# Patient Record
Sex: Female | Born: 1955
Health system: Southern US, Community
[De-identification: ages and names within clinical notes are randomized; demographics above are authoritative.]

## PROBLEM LIST (undated history)

## (undated) DIAGNOSIS — K219 Gastro-esophageal reflux disease without esophagitis: Secondary | ICD-10-CM

## (undated) DIAGNOSIS — Z9989 Dependence on other enabling machines and devices: Secondary | ICD-10-CM

## (undated) DIAGNOSIS — M1990 Unspecified osteoarthritis, unspecified site: Secondary | ICD-10-CM

## (undated) DIAGNOSIS — I10 Essential (primary) hypertension: Secondary | ICD-10-CM

## (undated) DIAGNOSIS — G4733 Obstructive sleep apnea (adult) (pediatric): Secondary | ICD-10-CM

## (undated) HISTORY — PX: TONSILLECTOMY: SUR1361

## (undated) HISTORY — PX: ABDOMINAL HYSTERECTOMY: SHX81

## (undated) HISTORY — DX: Essential (primary) hypertension: I10

## (undated) HISTORY — PX: OTHER SURGICAL HISTORY: SHX169

## (undated) HISTORY — PX: DILATION AND CURETTAGE OF UTERUS: SHX78

## (undated) HISTORY — DX: Dependence on other enabling machines and devices: Z99.89

## (undated) HISTORY — DX: Unspecified osteoarthritis, unspecified site: M19.90

## (undated) HISTORY — PX: BUNIONECTOMY: SHX129

## (undated) HISTORY — DX: Obstructive sleep apnea (adult) (pediatric): G47.33

---

## 1998-12-06 ENCOUNTER — Other Ambulatory Visit: Admission: RE | Admit: 1998-12-06 | Discharge: 1998-12-06 | Payer: Self-pay | Admitting: Obstetrics and Gynecology

## 1998-12-17 ENCOUNTER — Encounter: Admission: RE | Admit: 1998-12-17 | Discharge: 1998-12-17 | Payer: Self-pay | Admitting: Obstetrics and Gynecology

## 1999-12-19 ENCOUNTER — Other Ambulatory Visit: Admission: RE | Admit: 1999-12-19 | Discharge: 1999-12-19 | Payer: Self-pay | Admitting: Obstetrics and Gynecology

## 1999-12-31 ENCOUNTER — Encounter: Payer: Self-pay | Admitting: Obstetrics and Gynecology

## 1999-12-31 ENCOUNTER — Encounter: Admission: RE | Admit: 1999-12-31 | Discharge: 1999-12-31 | Payer: Self-pay | Admitting: Obstetrics and Gynecology

## 2000-12-21 ENCOUNTER — Other Ambulatory Visit: Admission: RE | Admit: 2000-12-21 | Discharge: 2000-12-21 | Payer: Self-pay | Admitting: Obstetrics and Gynecology

## 2000-12-23 ENCOUNTER — Ambulatory Visit (HOSPITAL_COMMUNITY): Admission: RE | Admit: 2000-12-23 | Discharge: 2000-12-23 | Payer: Self-pay | Admitting: Obstetrics and Gynecology

## 2000-12-23 ENCOUNTER — Encounter: Payer: Self-pay | Admitting: Obstetrics and Gynecology

## 2001-01-07 ENCOUNTER — Encounter: Admission: RE | Admit: 2001-01-07 | Discharge: 2001-01-07 | Payer: Self-pay | Admitting: Obstetrics and Gynecology

## 2001-01-07 ENCOUNTER — Encounter: Payer: Self-pay | Admitting: Obstetrics and Gynecology

## 2001-09-28 ENCOUNTER — Encounter (INDEPENDENT_AMBULATORY_CARE_PROVIDER_SITE_OTHER): Payer: Self-pay | Admitting: Specialist

## 2001-09-28 ENCOUNTER — Inpatient Hospital Stay (HOSPITAL_COMMUNITY): Admission: EM | Admit: 2001-09-28 | Discharge: 2001-09-30 | Payer: Self-pay | Admitting: Obstetrics and Gynecology

## 2002-01-10 ENCOUNTER — Encounter: Payer: Self-pay | Admitting: Obstetrics and Gynecology

## 2002-01-10 ENCOUNTER — Encounter: Admission: RE | Admit: 2002-01-10 | Discharge: 2002-01-10 | Payer: Self-pay | Admitting: Obstetrics and Gynecology

## 2003-01-13 ENCOUNTER — Encounter: Admission: RE | Admit: 2003-01-13 | Discharge: 2003-01-13 | Payer: Self-pay | Admitting: Obstetrics and Gynecology

## 2004-01-17 ENCOUNTER — Encounter: Admission: RE | Admit: 2004-01-17 | Discharge: 2004-01-17 | Payer: Self-pay | Admitting: Obstetrics and Gynecology

## 2005-01-17 ENCOUNTER — Encounter: Admission: RE | Admit: 2005-01-17 | Discharge: 2005-01-17 | Payer: Self-pay | Admitting: Obstetrics and Gynecology

## 2007-02-03 ENCOUNTER — Encounter: Admission: RE | Admit: 2007-02-03 | Discharge: 2007-02-03 | Payer: Self-pay | Admitting: Obstetrics and Gynecology

## 2008-12-14 ENCOUNTER — Encounter: Admission: RE | Admit: 2008-12-14 | Discharge: 2008-12-14 | Payer: Self-pay | Admitting: Obstetrics and Gynecology

## 2008-12-22 ENCOUNTER — Encounter: Admission: RE | Admit: 2008-12-22 | Discharge: 2008-12-22 | Payer: Self-pay | Admitting: Obstetrics and Gynecology

## 2009-12-25 ENCOUNTER — Encounter: Admission: RE | Admit: 2009-12-25 | Discharge: 2009-12-25 | Payer: Self-pay | Admitting: Obstetrics and Gynecology

## 2010-03-24 ENCOUNTER — Encounter: Payer: Self-pay | Admitting: Obstetrics and Gynecology

## 2010-07-19 NOTE — H&P (Signed)
Casey Newton, Casey Newton                         ACCOUNT NO.:  [id number]   MEDICAL RECORD NO.:  [id number]                   PATIENT TYPE:   LOCATION:                                       FACILITY:   PHYSICIAN:  Malachi Pro. Ambrose Mantle, M.D.              DATE OF BIRTH:   DATE OF ADMISSION:  09/28/2001  DATE OF DISCHARGE:                                HISTORY & PHYSICAL   PRESENT ILLNESS:  This is a 55 year old black female, para 2-0-0-2, who is  admitted to the hospital for abdominal hysterectomy and because of severe  menorrhagia and history of recurrent anemia.  Last menstrual period, September 14, 2001.  Her periods last about five days, with heavy for three days.  She  uses at least 10 pads a day with an overnight pad on her heavy days.  She  also soils her clothing in spite of the overnight pads.  She has cramping  with ovulation and has been placed on iron on several occasions.  When she  was seen in our office in October 2002 her hemoglobin was 8.9.  Dr. Artis Flock  had found her blood count to be low and had prescribed iron; gave her  Hemoccult.  The patient's hemoglobin had risen to 10.3 in June 2003.  Because of her continued heavy bleeding and ultrasound showing fibroids, one  of which probably has a submucous component, the patient is prepared for  hysterectomy.   ALLERGIES:  No known allergies.   PAST SURGICAL HISTORY:  1. Two C-sections.  2. Bunions removed.  3. Tubal ligation at the time of her second cesarean section.  4. Pilonidal cystectomy.  5. Cryosurgery for moderate dysplasia of the cervix.   PAST MEDICAL HISTORY:  She has had no major adult illnesses.  She is taking  medications for anemia (Chromagen).  She has no major heart problems.  She  does not drink nor smoke.   REVIEW OF SYMPTOMS:  Negative, except for occasional dizziness and  occasional pressure in her head.   FAMILY HISTORY:  Mother is 52, living and well.  Father 24 with high blood  pressure.  One sister  and one brother are living and well.   SOCIAL HISTORY:  The patient works as an Airline pilot.  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   PHYSICAL EXAMINATION:   GENERAL:  Reveals a well developed, somewhat obese, black female; in no  acute distress.   VITAL SIGNS:  Blood pressure 150/88, pulse 60, weight 190 pounds.   HEENT:  Reveal no cranial abnormalities.  Extraocular movements intact.  Nose and pharynx are clear.   NECK:  Supple without thyromegaly.   BREASTS:  Soft without masses.   HEART:  Normal size and sounds.  No murmurs.   LUNGS:  Clear to auscultation and percussion.   ABDOMEN:  Soft and flat, nontender.  No masses are palpable.  The  patient  has previous transverse incisions for two C-sections.   PELVIC:  The vulva and vagina are clean.  The cervix is clean.  A Pap smear  on December 21, 2000 was within normal limits.  The uterus is anterior, about  8-10 weeks size.  There is about a 4-5 cm anterior fibroid.  The adnexa are  free of masses.   RECTAL:  Exam done in October 2002 was negative.   ADMITTING IMPRESSION:  1. Severe menorrhagia.  2. History of significant anemia.  3. Mid-cycle pelvic  cramping.   PLAN:  The patient is prepared for hysterectomy.  The abdominal route is  chosen because of her prior two C-sections.  She wants to preserve her  ovaries and we plan to preserve the ovaries unless they are significantly  diseased.  She has also been counseled about the risks of surgery, including  but not limited, to heart attack, stroke, pulmonary embolus, wound  disruption, hemorrhage, need for re-operation and/or transfusion, fistula  formation, nerve injury and intestinal obstruction.  She understands and  agrees to proceed with surgery.  She is also told of the unpredictable  impact on her sex drive and sex function.  She understands.                                                Malachi Pro. Ambrose Mantle, M.D.    TFH/MEDQ  D:   09/28/2001  T:  09/28/2001  Job:  [id number]

## 2010-07-19 NOTE — Discharge Summary (Signed)
NAMELAYTON, TAPPAN                     ACCOUNT NO.:  [id number]   MEDICAL RECORD NO.:  [id number]                   PATIENT TYPE:  OBV   LOCATION:  0460                                 FACILITY:  [facility]   PHYSICIAN:  Malachi Pro. Ambrose Mantle, M.D.              DATE OF BIRTH:  10-31-1955   DATE OF ADMISSION:  09/28/2001  DATE OF DISCHARGE:  09/30/2001                                 DISCHARGE SUMMARY   HOSPITAL COURSE:  The patient is a 55 year old black female admitted for  abdominal hysterectomy because of severe menorrhagia and history of  significant anemia and fibroids.  The patient underwent abdominal  hysterectomy by Dr. Ambrose Mantle with Dr. Senaida Ores assisting under general  anesthesia with blood loss of about 300 cc.  Postoperatively the patient did  quite well.  She was doing well on the second postoperative day, and she was  ready for discharge.  She was ambulating well without difficulty, tolerating  liquids, and voiding well.  She had passed no flatus, but her abdomen was  soft and nontender.  Her incision appeared to be healing well.  On admission  her white count was 3700, hemoglobin 11.5, hematocrit 36.5, platelet count  314,000, follow-up hematocrits were 32.8 and 31.9, 49 neutrophils, 34  lymphs, 14 monos, 2 eosinophils, and 1 basophil.  Comprehensive metabolic  profile was completely within normal limits.  Urinalysis was negative.  Pathology report showed 223 g uterus with squamous metaplasia of the cervix.  No dysplasia.  Secretory endometrium.  No hyperplasia or malignancy.  Adenomyosis, leiomyomata, submucosal, intramural, and subserosal.  Uterine  serosal fibrous adhesions.  No endometriosis or malignancy identified.  Benign right and left fallopian tube segments, and benign paratubal cyst.  Benign squamous fragments with focal inflammation.  EKG showed borderline  EKG.  Minimal voltage criteria for LVH, may be normal variant.  Normal sinus  rhythm.   FINAL  DIAGNOSES:  1. Menorrhagia.  2. Anemia.  3. Leiomyomata uteri.  4. Adenomyosis.   OPERATION:  Abdominal hysterectomy.  Patient requested leaving her ovaries.   FINAL CONDITION:  Improved.   DISCHARGE INSTRUCTIONS:  Clear liquid diet or full liquid diet until passing  flatus or having a bowel movement.  Report any temperature elevation greater  than 100.4 degrees.  Report any heavy vaginal bleeding.  No strenuous  lifting or exertional activity.  No vaginal entrance.   FOLLOW-UP:  Return to the office in one to four days to have her staples  removed.  Return to the office as directed.   MEDICATIONS:  Mepergan Fortis 16 tablets 1 every four to six hours as needed  for pain.                                               Malachi Pro. Ambrose Mantle, M.D.  TFH/MEDQ  D:  09/30/2001  T:  10/06/2001  Job:  [id number]

## 2010-07-19 NOTE — Op Note (Signed)
Casey Newton, PANCHAL A                    ACCOUNT NO.:  [id number]   MEDICAL RECORD NO.:  [id number]                   PATIENT TYPE:  OBV   LOCATION:  0460                                 FACILITY:  [facility]   PHYSICIAN:  Malachi Pro. Ambrose Mantle, M.D.              DATE OF BIRTH:  [birth year]   DATE OF PROCEDURE:  09/28/2001  DATE OF DISCHARGE:                                 OPERATIVE REPORT   PREOPERATIVE DIAGNOSIS:  Menorrhagia, anemia, fibroids.   POSTOPERATIVE DIAGNOSIS:  Same.   OPERATION:  Abdominal hysterectomy.   SURGEON:  Malachi Pro. Ambrose Mantle, M.D.   Threasa HeadsSenaida Ores   ANESTHESIA:  General.   DESCRIPTION OF PROCEDURE:  The patient was brought to the operating room and  placed under satisfactory general anesthesia. She was placed in the frog-leg  position.  The abdomen, vulva, vagina and urethra were prepped with Betadine  solution and a Foley catheter was inserted straight drain.  Examination  revealed the uterus to be multinodular with fibroids, about eight to ten  weeks size.  The adnexae were free of masses.  The patient was placed  supine.  The abdomen was draped as a sterile field and a transverse incision  was made through the old C-section scars and carried in layers through the  skin, subcutaneous tissue and fascia.   The fascia was separated from the rectus muscles superiorly and inferiorly  and the rectus muscle was then divided in the midline.  There was some  bleeding that was sutured and also treated with Bovie.  Exploration of the  upper abdomen after entering the peritoneal cavity revealed both kidneys to  feel normal.  Just the edge of the liver could be felt and it was normal.  The gallbladder was smooth and tensely cystic but I did not feel any stones.  Exploration of the pelvis revealed the uterus to be multinodular, about ten  weeks size.   Both tubes had previously been ligated.  The ovaries were normal.  The cul-  de-sacs were free of disease.   The upper pedicles were clamped across after  packs and retractors were used for exposure.  The round ligaments were  bilaterally divided with electrical current and the bladder flap was  developed.  The upper pedicles were then cut between clamps and doubly  suture ligated.  The uterine vessels were skeletonized, clamped, cut and  suture ligated and the parametrial and paracervical tissues were clamped,  cut and suture ligated.   The uterosacral ligaments were clamped, cut and suture ligated and held and  the right vaginal angle was entered and the uterus was removed by  transecting the upper vagina.  There was a small amount of tissue close to  the right vaginal angle that we thought possibly could have included some  cervix.  I removed this.  I then placed vaginal angle sutures and closed the  central portion of the vagina with interrupted figure-of-eight  sutures of 0  nylon.  Hemostasis was adequate.  Both ureters were palpated and were  normal.   The uterosacral ligaments were sutured together in the midline.  The bladder  was filled with methylene blue stained fluid.  There was no leakage and no  sutures close to the bladder.  I then reperitonealized with 0 Vicryl over  the vaginal cuff after liberal irrigation confirmed hemostasis.  Then, the  packs and retractors were removed.  The peritoneum was closed with a running  suture of 0 Vicryl.  The rectus muscle was not sutured but hemostasis was  achieved in the rectus muscle and then the fascia was closed with two  running sutures of 0 Vicryl, the subcu with a running 3-0 Vicryl and the  skin was closed with automatic staples.   The patient seemed to tolerate the procedure well.  The blood loss was  estimated at 250 cc.  The sponge and needle counts were correct and she was  returned to the recovery room in satisfactory condition.                                               Malachi Pro. Ambrose Mantle, M.D.    TFH/MEDQ  D:  09/28/2001   T:  09/30/2001  Job:  [id number]   cc:   Malachi Pro. Ambrose Mantle, M.D.

## 2011-01-02 ENCOUNTER — Other Ambulatory Visit: Payer: Self-pay | Admitting: Obstetrics and Gynecology

## 2011-01-02 DIAGNOSIS — Z1231 Encounter for screening mammogram for malignant neoplasm of breast: Secondary | ICD-10-CM

## 2011-01-06 ENCOUNTER — Ambulatory Visit
Admission: RE | Admit: 2011-01-06 | Discharge: 2011-01-06 | Disposition: A | Discharge status: Home / Self Care | Payer: BC Managed Care – PPO | Source: Ambulatory Visit | Attending: Obstetrics and Gynecology | Admitting: Obstetrics and Gynecology

## 2011-01-06 DIAGNOSIS — Z1231 Encounter for screening mammogram for malignant neoplasm of breast: Secondary | ICD-10-CM

## 2011-12-26 ENCOUNTER — Other Ambulatory Visit: Payer: Self-pay | Admitting: Obstetrics and Gynecology

## 2011-12-26 DIAGNOSIS — Z1231 Encounter for screening mammogram for malignant neoplasm of breast: Secondary | ICD-10-CM

## 2012-01-12 ENCOUNTER — Ambulatory Visit: Payer: BC Managed Care – PPO

## 2012-06-30 ENCOUNTER — Ambulatory Visit
Admission: RE | Admit: 2012-06-30 | Discharge: 2012-06-30 | Disposition: A | Discharge status: Home / Self Care | Payer: BC Managed Care – PPO | Source: Ambulatory Visit | Attending: Obstetrics and Gynecology | Admitting: Obstetrics and Gynecology

## 2012-06-30 DIAGNOSIS — Z1231 Encounter for screening mammogram for malignant neoplasm of breast: Secondary | ICD-10-CM

## 2012-09-15 ENCOUNTER — Ambulatory Visit (HOSPITAL_BASED_OUTPATIENT_CLINIC_OR_DEPARTMENT_OTHER): Payer: BC Managed Care – PPO | Attending: Unknown Physician Specialty | Admitting: Radiology

## 2012-09-15 VITALS — Ht 61.0 in | Wt 220.0 lb

## 2012-09-15 DIAGNOSIS — G4733 Obstructive sleep apnea (adult) (pediatric): Secondary | ICD-10-CM | POA: Insufficient documentation

## 2012-09-15 DIAGNOSIS — R0683 Snoring: Secondary | ICD-10-CM

## 2012-09-18 DIAGNOSIS — R0609 Other forms of dyspnea: Secondary | ICD-10-CM

## 2012-09-18 DIAGNOSIS — G4733 Obstructive sleep apnea (adult) (pediatric): Secondary | ICD-10-CM

## 2012-09-18 DIAGNOSIS — R0989 Other specified symptoms and signs involving the circulatory and respiratory systems: Secondary | ICD-10-CM

## 2012-09-18 NOTE — Procedures (Signed)
Casey Newton, Casey Newton           ACCOUNT NO.:  [id number]  MEDICAL RECORD NO.:  [id number]          PATIENT TYPE:  OUT  LOCATION:  SLEEP CENTER                 FACILITY:  [facility]  PHYSICIAN:  Jalen Oberry D. Maple Hudson, MD, FCCP, FACPDATE OF BIRTH:  1956-01-18  DATE OF STUDY:  09/15/2012                           NOCTURNAL POLYSOMNOGRAM  REFERRING PHYSICIAN:  Pearletha Furl KEEVER  REFERRING PHYSICIAN:  Dr. Theadora Rama.  INDICATION FOR STUDY:  Hypersomnia with sleep apnea.  EPWORTH SLEEPINESS SCORE:  7/24.  BMI 41.6, weight 220 pounds, height 61 inches, neck 17 inches.  HOME MEDICATION:  Charted for review.  SLEEP ARCHITECTURE:  Split study protocol.  During the diagnostic phase, total sleep time 121 minutes with sleep efficiency 81.8%.  Stage I was 7.4%, stage II 56.6%, stage III 12.4%, REM 23.6% of total sleep time. Sleep latency 11 minutes.  REM latency 49 minutes.  Awake after sleep onset 13.5 minutes.  Arousal index 19.3.  Bedtime medication:  None.  RESPIRATORY DATA:  Split study protocol.  Apnea-hypopnea index (AHI) 29.3 per hour.  A total of 59 events were scored including 58 obstructive apneas and 1 hypopnea.  Events were associated with nonsupine sleep position.  REM AHI 63.2 per hour.  CPAP was titrated to 16 CWP, AHI 7.8 per hour.  She wore a small ResMed Quattro FX full-face mask with heated humidifier.  Residual events were mostly central apneas.  OXYGEN DATA:  Before CPAP, snoring was loud with oxygen desaturation to a nadir of 36% transiently on room air.  With CPAP control, snoring was prevented and mean oxygen saturation held 96.4% on room air.  CARDIAC DATA:  Sinus rhythm with occasional PAC.  MOVEMENT/PARASOMNIA:  A few incidental limb jerks were noted with little effect on sleep.  Bathroom x2.  IMPRESSION/RECOMMENDATION: 1. Moderate obstructive sleep apnea/hypopnea syndrome, AHI 29.3 per     hour with mainly nonsupine events.  REM AHI 63.2 per hour.  Loud  snoring with oxygen desaturation transiently to a nadir of 36% on     room air. 2. Successful CPAP titration to 16 CWP, AHI 7.8 per hour.  A few     residual central apneas developed, of little     clinical significance.  She wore a small ResMed Quattro FX full-     face mask with heated humidifier.  Snoring was prevented and mean     oxygen saturation held 96.4% on room air with CPAP.     Laisa Larrick D. Maple Hudson, MD, [facility], FACP Diplomate, American Board of Sleep Medicine    CDY/MEDQ  D:  09/18/2012 10:59:03  T:  09/18/2012 14:07:53  Job:  [id number]

## 2012-12-29 ENCOUNTER — Encounter: Payer: Self-pay | Admitting: Internal Medicine

## 2013-02-17 ENCOUNTER — Ambulatory Visit (AMBULATORY_SURGERY_CENTER): Payer: Self-pay

## 2013-02-17 VITALS — Ht 61.5 in | Wt 221.0 lb

## 2013-02-17 DIAGNOSIS — Z1211 Encounter for screening for malignant neoplasm of colon: Secondary | ICD-10-CM

## 2013-02-17 MED ORDER — NA SULFATE-K SULFATE-MG SULF 17.5-3.13-1.6 GM/177ML PO SOLN: ORAL | Status: DC

## 2013-02-18 ENCOUNTER — Encounter: Payer: Self-pay | Admitting: Internal Medicine

## 2013-03-07 ENCOUNTER — Telehealth: Payer: Self-pay | Admitting: Internal Medicine

## 2013-03-08 NOTE — Telephone Encounter (Signed)
No charge. 

## 2013-03-09 ENCOUNTER — Encounter: Payer: BC Managed Care – PPO | Admitting: Internal Medicine

## 2013-03-24 ENCOUNTER — Telehealth: Payer: Self-pay | Admitting: Internal Medicine

## 2013-03-24 NOTE — Telephone Encounter (Signed)
No charge. 

## 2013-03-28 ENCOUNTER — Encounter: Payer: BC Managed Care – PPO | Admitting: Internal Medicine

## 2013-08-12 ENCOUNTER — Other Ambulatory Visit: Payer: Self-pay

## 2013-08-12 DIAGNOSIS — Z1231 Encounter for screening mammogram for malignant neoplasm of breast: Secondary | ICD-10-CM

## 2013-08-18 ENCOUNTER — Encounter (INDEPENDENT_AMBULATORY_CARE_PROVIDER_SITE_OTHER): Payer: Self-pay

## 2013-08-18 ENCOUNTER — Ambulatory Visit
Admission: RE | Admit: 2013-08-18 | Discharge: 2013-08-18 | Disposition: A | Discharge status: Home / Self Care | Payer: BC Managed Care – PPO | Source: Ambulatory Visit

## 2013-08-18 DIAGNOSIS — Z1231 Encounter for screening mammogram for malignant neoplasm of breast: Secondary | ICD-10-CM

## 2014-06-12 ENCOUNTER — Emergency Department (HOSPITAL_COMMUNITY)
Admission: EM | Admit: 2014-06-12 | Discharge: 2014-06-12 | Disposition: A | Discharge status: Home / Self Care | Payer: BLUE CROSS/BLUE SHIELD | Source: Home / Self Care | Attending: Emergency Medicine | Admitting: Emergency Medicine

## 2014-06-12 ENCOUNTER — Encounter (HOSPITAL_COMMUNITY): Payer: Self-pay | Admitting: *Deleted

## 2014-06-12 DIAGNOSIS — I10 Essential (primary) hypertension: Secondary | ICD-10-CM

## 2014-06-12 LAB — POCT I-STAT, CHEM 8
BUN: 11 mg/dL (ref 6–23)
CREATININE: 1 mg/dL (ref 0.50–1.10)
Calcium, Ion: 1.18 mmol/L (ref 1.12–1.23)
Chloride: 101 mmol/L (ref 96–112)
Glucose, Bld: 98 mg/dL (ref 70–99)
HCT: 45 % (ref 36.0–46.0)
HEMOGLOBIN: 15.3 g/dL — AB (ref 12.0–15.0)
POTASSIUM: 3.9 mmol/L (ref 3.5–5.1)
SODIUM: 141 mmol/L (ref 135–145)
TCO2: 27 mmol/L (ref 0–100)

## 2014-06-12 MED ORDER — LOSARTAN POTASSIUM-HCTZ 100-12.5 MG PO TABS: 1.0000 | ORAL_TABLET | Freq: Every day | ORAL | Status: DC

## 2014-06-12 NOTE — Discharge Instructions (Signed)
Your blood work is normal. I have sent a 3 month supply of your blood pressure medicines your pharmacy. Continue to work on finding a primary care doctor.

## 2014-06-12 NOTE — ED Notes (Signed)
Pt is asking for med refills for her BP     Her last dose was yesterday    She denies any problems

## 2014-06-12 NOTE — ED Provider Notes (Signed)
CSN: [id number]     Arrival date & time 06/12/14  0957 History   First MD Initiated Contact with Patient 06/12/14 1047     Chief Complaint  Patient presents with  . Medication Refill   (Consider location/radiation/quality/duration/timing/severity/associated sxs/prior Treatment) HPI  She is a 59 year old woman here for medication refill. She states she has been struggling to find a regular physician since she retired a year and a half ago. She does have an OB/GYN who she follows with regularly. She is on losartan-hydrochlorothiazide daily for blood pressure. Her last dose was yesterday. No chest pain or shortness of breath.  Past Medical History  Diagnosis Date  . Hypertension   . OSA on CPAP   . Arthritis     left hip   Past Surgical History  Procedure Laterality Date  . Cesarean section      2 times  . Abdominal hysterectomy      left ovaries  . Bunionectomy      left foot  . Tonsillectomy    . Dental implants     Family History  Problem Relation Age of Onset  . Liver disease Mother   . Leukemia Father    History  Substance Use Topics  . Smoking status: Never Smoker   . Smokeless tobacco: Never Used  . Alcohol Use: No   OB History    No data available     Review of Systems  All other systems reviewed and are negative.   Allergies  Penicillins  Home Medications   Prior to Admission medications   Medication Sig Start Date End Date Taking? Authorizing Provider  aspirin 81 MG tablet Take 81 mg by mouth daily.   Yes Historical Provider, MD  calcium carbonate 200 MG capsule Take 800 mg by mouth daily.   Yes Historical Provider, MD  Multiple Vitamin (MULTIVITAMIN) tablet Take 1 tablet by mouth daily.   Yes Historical Provider, MD  Omega-3 Fatty Acids (FISH OIL) 1000 MG CAPS Take by mouth daily.   Yes Historical Provider, MD  PAPAYA PO Take by mouth as needed.   Yes Historical Provider, MD  losartan-hydrochlorothiazide (HYZAAR) 100-12.5 MG per tablet Take 1 tablet  by mouth daily. 06/12/14   Charm RingsErin J Honig, MD  Na Sulfate-K Sulfate-Mg Sulf (SUPREP BOWEL PREP) SOLN Suprep as directed / no substitutions 02/17/13   Iva Booparl E Gessner, MD   BP 177/83 mmHg  Pulse 64  Temp(Src) 98.7 F (37.1 C) (Oral)  Resp 18  SpO2 95% Physical Exam  Constitutional: She is oriented to person, place, and time. She appears well-developed and well-nourished. No distress.  Neck: Neck supple.  Cardiovascular: Normal rate and regular rhythm.   No murmur heard. Pulmonary/Chest: Effort normal.  Neurological: She is alert and oriented to person, place, and time.    ED Course  Procedures (including critical care time) Labs Review Labs Reviewed  POCT I-STAT, CHEM 8 - Abnormal; Notable for the following:    Hemoglobin 15.3 (*)    All other components within normal limits    Imaging Review No results found.   MDM   1. Essential hypertension    I-STAT reviewed and within normal limits. I've provided a 90 day supply of her hypertension medication. She will continue looking for a PCP.    Charm RingsErin J Honig, MD 06/12/14 [phone]

## 2014-08-03 ENCOUNTER — Ambulatory Visit (INDEPENDENT_AMBULATORY_CARE_PROVIDER_SITE_OTHER): Payer: BLUE CROSS/BLUE SHIELD | Admitting: Family Medicine

## 2014-08-03 VITALS — BP 142/80 | HR 67 | Temp 98.4°F | Resp 16 | Ht 61.0 in | Wt 212.8 lb

## 2014-08-03 DIAGNOSIS — L237 Allergic contact dermatitis due to plants, except food: Secondary | ICD-10-CM | POA: Diagnosis not present

## 2014-08-03 MED ORDER — METHYLPREDNISOLONE 4 MG PO TBPK
ORAL_TABLET | ORAL | Status: DC
Start: 2014-08-03 — End: 2014-09-13

## 2014-08-03 NOTE — Progress Notes (Signed)
Chief Complaint:  Chief Complaint  Patient presents with  . Rash    arms,legs, x 4 days ago was working in yard    HPI: Casey Newton is a 59 y.o. female who is here for  dermatitis of her arms and legs. She has had it for the last 4 days. It was due to poison ivy exposure.  Her garden looks great now. She denies any chest pain or shortness of breath. She feels that it might be coming on her face a little bit but there is no visible lesions.  Has tried bendaryl  Past Medical History  Diagnosis Date  . Hypertension   . OSA on CPAP   . Arthritis     left hip   Past Surgical History  Procedure Laterality Date  . Cesarean section      2 times  . Abdominal hysterectomy      left ovaries  . Bunionectomy      left foot  . Tonsillectomy    . Dental implants     History   Social History  . Marital Status: Married    Spouse Name: N/A  . Number of Children: N/A  . Years of Education: N/A   Social History Main Topics  . Smoking status: Never Smoker   . Smokeless tobacco: Never Used  . Alcohol Use: No  . Drug Use: No  . Sexual Activity: Not on file   Other Topics Concern  . None   Social History Narrative   Family History  Problem Relation Age of Onset  . Liver disease Mother   . Leukemia Father    Allergies  Allergen Reactions  . Penicillins     Upset stomach   Prior to Admission medications   Medication Sig Start Date End Date Taking? Authorizing Provider  aspirin 81 MG tablet Take 81 mg by mouth daily.   Yes Historical Provider, MD  calcium carbonate 200 MG capsule Take 800 mg by mouth daily.   Yes Historical Provider, MD  losartan-hydrochlorothiazide (HYZAAR) 100-12.5 MG per tablet Take 1 tablet by mouth daily. 06/12/14  Yes Charm Rings, MD  Multiple Vitamin (MULTIVITAMIN) tablet Take 1 tablet by mouth daily.   Yes Historical Provider, MD  Na Sulfate-K Sulfate-Mg Sulf (SUPREP BOWEL PREP) SOLN Suprep as directed / no substitutions 02/17/13  Yes  Iva Boop, MD  Omega-3 Fatty Acids (FISH OIL) 1000 MG CAPS Take by mouth daily.   Yes Historical Provider, MD  PAPAYA PO Take by mouth as needed.   Yes Historical Provider, MD  methylPREDNISolone (MEDROL DOSEPAK) 4 MG TBPK tablet Take as directed 08/03/14   Georgie Eduardo P Marysa Wessner, DO     ROS: The patient denies fevers, chills, night sweats, unintentional weight loss, chest pain, palpitations, wheezing, dyspnea on exertion, nausea, vomiting, abdominal pain, dysuria, hematuria, melena, numbness, weakness, or tingling.   All other systems have been reviewed and were otherwise negative with the exception of those mentioned in the HPI and as above.    PHYSICAL EXAM: Filed Vitals:   08/03/14 0943  BP: 142/80  Pulse: 67  Temp: 98.4 F (36.9 C)  Resp: 16   Filed Vitals:   08/03/14 0943  Height:  (1.549 m)  Weight: 212 lb 12.8 oz (96.525 kg)   Body mass index is 40.23 kg/(m^2).  General: Alert, no acute distress HEENT:  Normocephalic, atraumatic, oropharynx patent. EOMI, PERRLA Cardiovascular:  Regular rate and rhythm, no rubs murmurs or gallops.  No Carotid bruits, radial pulse intact. No pedal edema.  Respiratory: Clear to auscultation bilaterally.  No wheezes, rales, or rhonchi.  No cyanosis, no use of accessory musculature GI: No organomegaly, abdomen is soft and non-tender, positive bowel sounds.  No masses. Skin: Positive poison ivy dermatitis Neurologic: Facial musculature symmetric. Psychiatric: Patient is appropriate throughout our interaction. Lymphatic: No cervical lymphadenopathy Musculoskeletal: Gait intact.   LABS: Results for orders placed or performed during the hospital encounter of 06/12/14  I-STAT, chem 8  Result Value Ref Range   Sodium 141 135 - 145 mmol/L   Potassium 3.9 3.5 - 5.1 mmol/L   Chloride 101 96 - 112 mmol/L   BUN 11 6 - 23 mg/dL   Creatinine, Ser 9.621.00 0.50 - 1.10 mg/dL   Glucose, Bld 98 70 - 99 mg/dL   Calcium, Ion 9.521.18 8.411.12 - 1.23 mmol/L   TCO2 27  0 - 100 mmol/L   Hemoglobin 15.3 (H) 12.0 - 15.0 g/dL   HCT 32.445.0 40.136.0 - 02.746.0 %     EKG/XRAY:   [facility]   ASSESSMENT/PLAN: Encounter Diagnosis  Name Primary?  . Poison ivy dermatitis Yes   Rx Medrol Dosepak Cont with antihistamine, calamine lotion Fu prn   Gross sideeffects, risk and benefits, and alternatives of medications d/w patient. Patient is aware that all medications have potential sideeffects and we are unable to predict every sideeffect or drug-drug interaction that may occur.  Alece Koppel PHUONG, DO 08/03/2014 11:24 AM

## 2014-09-13 ENCOUNTER — Encounter: Payer: Self-pay | Admitting: Internal Medicine

## 2014-09-13 ENCOUNTER — Ambulatory Visit (INDEPENDENT_AMBULATORY_CARE_PROVIDER_SITE_OTHER): Payer: BLUE CROSS/BLUE SHIELD | Admitting: Internal Medicine

## 2014-09-13 ENCOUNTER — Other Ambulatory Visit (INDEPENDENT_AMBULATORY_CARE_PROVIDER_SITE_OTHER): Payer: BLUE CROSS/BLUE SHIELD

## 2014-09-13 DIAGNOSIS — G4733 Obstructive sleep apnea (adult) (pediatric): Secondary | ICD-10-CM | POA: Insufficient documentation

## 2014-09-13 DIAGNOSIS — I10 Essential (primary) hypertension: Secondary | ICD-10-CM | POA: Insufficient documentation

## 2014-09-13 LAB — LIPID PANEL
CHOL/HDL RATIO: 3
CHOLESTEROL: 148 mg/dL (ref 0–200)
HDL: 55.8 mg/dL (ref >39.00)
LDL CALC: 72 mg/dL (ref 0–99)
NonHDL: 92.2
Triglycerides: 102 mg/dL (ref 0.0–149.0)
VLDL: 20.4 mg/dL (ref 0.0–40.0)

## 2014-09-13 LAB — VITAMIN B12: Vitamin B-12: 579 pg/mL (ref 211–911)

## 2014-09-13 LAB — HEMOGLOBIN A1C: HEMOGLOBIN A1C: 5.6 % (ref 4.6–6.5)

## 2014-09-13 LAB — COMPREHENSIVE METABOLIC PANEL
ALBUMIN: 4.1 g/dL (ref 3.5–5.2)
ALT: 24 U/L (ref 0–35)
AST: 20 U/L (ref 0–37)
Alkaline Phosphatase: 104 U/L (ref 39–117)
BILIRUBIN TOTAL: 0.4 mg/dL (ref 0.2–1.2)
BUN: 12 mg/dL (ref 6–23)
CO2: 33 meq/L — AB (ref 19–32)
CREATININE: 0.89 mg/dL (ref 0.40–1.20)
Calcium: 9.4 mg/dL (ref 8.4–10.5)
Chloride: 104 mEq/L (ref 96–112)
GFR: 83.26 mL/min (ref >60.00)
Glucose, Bld: 100 mg/dL — ABNORMAL HIGH (ref 70–99)
POTASSIUM: 3.9 meq/L (ref 3.5–5.1)
Sodium: 142 mEq/L (ref 135–145)
TOTAL PROTEIN: 7.1 g/dL (ref 6.0–8.3)

## 2014-09-13 NOTE — Progress Notes (Signed)
Pre visit review using our clinic review tool, if applicable. No additional management support is needed unless otherwise documented below in the visit note. 

## 2014-09-13 NOTE — Assessment & Plan Note (Signed)
Patient not exercising since Christmas and up weight. Talked to her about the need for exercise and working on her diet. Checking for sequelae of her weight with labs. It is complicated by OSA and HTN.

## 2014-09-13 NOTE — Patient Instructions (Signed)
We have gotten the order for the CPAP supplies and can send it to the home health company that handles your CPAP.   We will check the blood work and will call you back with the results.   We have sent in the blood pressure medicine.  We can see you back next year. If you have new problems or questions before then please feel free to call the office.  Work on getting back to the exercise regularly for the health.  Exercise to Lose Weight Exercise and a healthy diet may help you lose weight. Your doctor may suggest specific exercises. EXERCISE IDEAS AND TIPS  Choose low-cost things you enjoy doing, such as walking, bicycling, or exercising to workout videos.  Take stairs instead of the elevator.  Walk during your lunch break.  Park your car further away from work or school.  Go to a gym or an exercise class.  Start with 5 to 10 minutes of exercise each day. Build up to 30 minutes of exercise 4 to 6 days a week.  Wear shoes with good support and comfortable clothes.  Stretch before and after working out.  Work out until you breathe harder and your heart beats faster.  Drink extra water when you exercise.  Do not do so much that you hurt yourself, feel dizzy, or get very short of breath. Exercises that burn about 150 calories:  Running 1  miles in 15 minutes.  Playing volleyball for 45 to 60 minutes.  Washing and waxing a car for 45 to 60 minutes.  Playing touch football for 45 minutes.  Walking 1  miles in 35 minutes.  Pushing a stroller 1  miles in 30 minutes.  Playing basketball for 30 minutes.  Raking leaves for 30 minutes.  Bicycling 5 miles in 30 minutes.  Walking 2 miles in 30 minutes.  Dancing for 30 minutes.  Shoveling snow for 15 minutes.  Swimming laps for 20 minutes.  Walking up stairs for 15 minutes.  Bicycling 4 miles in 15 minutes.  Gardening for 30 to 45 minutes.  Jumping rope for 15 minutes.  Washing windows or floors for 45 to 60  minutes. Document Released: 03/22/2010 Document Revised: 05/12/2011 Document Reviewed: 03/22/2010 [facility] Patient Information 2015 [place], [place]. This information is not intended to replace advice given to you by your health care provider. Make sure you discuss any questions you have with your health care provider.

## 2014-09-13 NOTE — Assessment & Plan Note (Signed)
Refill provided of losartan/hctz. Checking labs today and adjust as needed. BP at goal.

## 2014-09-13 NOTE — Progress Notes (Signed)
   Subjective:    Patient ID: Casey Newton, female    DOB: [birth year], 59 y.o.   MRN: [id number]  HPI The patient is a new 59 YO female coming in for supplies for her CPAP and her blood pressure. She is doing well overall with her blood pressure and has been on the same medicine for several years. No known complications and denies side effects from her medicines. No headaches, chest pains, SOB.   [facility], social history reviewed and updated.   Review of Systems  Constitutional: Positive for activity change. Negative for fever, chills, appetite change, fatigue and unexpected weight change.       Not exercising lately  HENT: Negative.   Eyes: Negative.   Respiratory: Negative for cough, chest tightness, shortness of breath and wheezing.   Cardiovascular: Negative for chest pain, palpitations and leg swelling.  Gastrointestinal: Negative for nausea, abdominal pain, diarrhea, constipation and abdominal distention.  Musculoskeletal: Positive for arthralgias. Negative for myalgias, back pain and gait problem.  Skin: Negative.   Neurological: Negative.   Psychiatric/Behavioral: Negative.       Objective:   Physical Exam  Constitutional: She is oriented to person, place, and time. She appears well-developed and well-nourished.  Overweight  HENT:  Head: Normocephalic and atraumatic.  Right Ear: External ear normal.  Left Ear: External ear normal.  Eyes: EOM are normal.  Neck: Normal range of motion.  Cardiovascular: Normal rate and regular rhythm.   No murmur heard. Carotids without bruits bilaterally  Pulmonary/Chest: Effort normal and breath sounds normal. No respiratory distress. She has no wheezes.  Abdominal: Soft. Bowel sounds are normal. She exhibits no distension. There is no tenderness. There is no rebound.  Musculoskeletal: She exhibits no edema.  Neurological: She is alert and oriented to person, place, and time.  Skin: Skin is warm and dry.  Psychiatric: She has a  normal mood and affect.   Filed Vitals:   09/13/14 1011  BP: 134/78  Pulse: 62  Temp: 98.4 F (36.9 C)  TempSrc: Oral  Resp: 12  Height: 5\' 1"  (1.549 m)  Weight: 214 lb (97.07 kg)  SpO2: 98%      Assessment & Plan:

## 2014-09-13 NOTE — Assessment & Plan Note (Signed)
Needs CPAP supplies. Rx printed and signed today. She will let us know what home health company and we will fax this to them to get her the supplies. Does not need new machine. She cannot remember at this time her sleep test or settings. Will obtain records from previous PCP for those.

## 2014-09-14 ENCOUNTER — Other Ambulatory Visit: Payer: Self-pay | Admitting: Geriatric Medicine

## 2014-09-14 MED ORDER — LOSARTAN POTASSIUM-HCTZ 100-12.5 MG PO TABS: 1.0000 | ORAL_TABLET | Freq: Every day | ORAL | Status: DC

## 2014-10-09 ENCOUNTER — Other Ambulatory Visit: Payer: Self-pay

## 2014-10-09 DIAGNOSIS — Z1231 Encounter for screening mammogram for malignant neoplasm of breast: Secondary | ICD-10-CM

## 2014-10-16 ENCOUNTER — Ambulatory Visit
Admission: RE | Admit: 2014-10-16 | Discharge: 2014-10-16 | Disposition: A | Discharge status: Home / Self Care | Payer: BLUE CROSS/BLUE SHIELD | Source: Ambulatory Visit

## 2014-10-16 DIAGNOSIS — Z1231 Encounter for screening mammogram for malignant neoplasm of breast: Secondary | ICD-10-CM

## 2014-11-11 ENCOUNTER — Emergency Department (HOSPITAL_COMMUNITY): Payer: BLUE CROSS/BLUE SHIELD

## 2014-11-11 ENCOUNTER — Encounter (HOSPITAL_COMMUNITY): Payer: Self-pay | Admitting: Emergency Medicine

## 2014-11-11 ENCOUNTER — Encounter (HOSPITAL_COMMUNITY): Payer: Self-pay | Admitting: *Deleted

## 2014-11-11 ENCOUNTER — Emergency Department (HOSPITAL_COMMUNITY)
Admission: EM | Admit: 2014-11-11 | Discharge: 2014-11-11 | Disposition: A | Discharge status: Nursing Home (Includes ICF) | Payer: BLUE CROSS/BLUE SHIELD | Source: Home / Self Care | Attending: Family Medicine | Admitting: Family Medicine

## 2014-11-11 ENCOUNTER — Inpatient Hospital Stay (HOSPITAL_COMMUNITY)
Admission: EM | Admit: 2014-11-11 | Discharge: 2014-11-13 | DRG: 418 | Disposition: A | Discharge status: Home / Self Care | Payer: BLUE CROSS/BLUE SHIELD | Attending: General Surgery | Admitting: General Surgery

## 2014-11-11 DIAGNOSIS — R1031 Right lower quadrant pain: Secondary | ICD-10-CM | POA: Diagnosis not present

## 2014-11-11 DIAGNOSIS — N390 Urinary tract infection, site not specified: Secondary | ICD-10-CM | POA: Diagnosis present

## 2014-11-11 DIAGNOSIS — K660 Peritoneal adhesions (postprocedural) (postinfection): Secondary | ICD-10-CM | POA: Diagnosis present

## 2014-11-11 DIAGNOSIS — I10 Essential (primary) hypertension: Secondary | ICD-10-CM | POA: Diagnosis present

## 2014-11-11 DIAGNOSIS — K8000 Calculus of gallbladder with acute cholecystitis without obstruction: Secondary | ICD-10-CM | POA: Diagnosis not present

## 2014-11-11 DIAGNOSIS — R1011 Right upper quadrant pain: Secondary | ICD-10-CM | POA: Diagnosis not present

## 2014-11-11 DIAGNOSIS — Z6841 Body Mass Index (BMI) 40.0 and over, adult: Secondary | ICD-10-CM

## 2014-11-11 DIAGNOSIS — K810 Acute cholecystitis: Secondary | ICD-10-CM

## 2014-11-11 DIAGNOSIS — K819 Cholecystitis, unspecified: Secondary | ICD-10-CM | POA: Diagnosis present

## 2014-11-11 DIAGNOSIS — G4733 Obstructive sleep apnea (adult) (pediatric): Secondary | ICD-10-CM | POA: Diagnosis present

## 2014-11-11 DIAGNOSIS — Z7982 Long term (current) use of aspirin: Secondary | ICD-10-CM

## 2014-11-11 LAB — CBC
HCT: 40 % (ref 36.0–46.0)
Hemoglobin: 13.1 g/dL (ref 12.0–15.0)
MCH: 27.9 pg (ref 26.0–34.0)
MCHC: 32.8 g/dL (ref 30.0–36.0)
MCV: 85.3 fL (ref 78.0–100.0)
PLATELETS: 268 10*3/uL (ref 150–400)
RBC: 4.69 MIL/uL (ref 3.87–5.11)
RDW: 14 % (ref 11.5–15.5)
WBC: 19.5 10*3/uL — AB (ref 4.0–10.5)

## 2014-11-11 LAB — COMPREHENSIVE METABOLIC PANEL
ALK PHOS: 112 U/L (ref 38–126)
ALT: 35 U/L (ref 14–54)
AST: 41 U/L (ref 15–41)
Albumin: 4 g/dL (ref 3.5–5.0)
Anion gap: 14 (ref 5–15)
BILIRUBIN TOTAL: 0.9 mg/dL (ref 0.3–1.2)
BUN: 8 mg/dL (ref 6–20)
CALCIUM: 9.1 mg/dL (ref 8.9–10.3)
CHLORIDE: 96 mmol/L — AB (ref 101–111)
CO2: 23 mmol/L (ref 22–32)
CREATININE: 0.88 mg/dL (ref 0.44–1.00)
Glucose, Bld: 129 mg/dL — ABNORMAL HIGH (ref 65–99)
Potassium: 3.9 mmol/L (ref 3.5–5.1)
Sodium: 133 mmol/L — ABNORMAL LOW (ref 135–145)
TOTAL PROTEIN: 7.7 g/dL (ref 6.5–8.1)

## 2014-11-11 LAB — URINALYSIS, ROUTINE W REFLEX MICROSCOPIC
GLUCOSE, UA: NEGATIVE mg/dL
KETONES UR: 15 mg/dL — AB
NITRITE: NEGATIVE
PH: 6 (ref 5.0–8.0)
PROTEIN: 100 mg/dL — AB
Specific Gravity, Urine: 1.027 (ref 1.005–1.030)
Urobilinogen, UA: 1 mg/dL (ref 0.0–1.0)

## 2014-11-11 LAB — URINE MICROSCOPIC-ADD ON

## 2014-11-11 LAB — LIPASE, BLOOD: Lipase: 18 U/L — ABNORMAL LOW (ref 22–51)

## 2014-11-11 MED ORDER — SODIUM CHLORIDE 0.9 % IV BOLUS (SEPSIS)
1000.0000 mL | Freq: Once | INTRAVENOUS | Status: AC
  Administered 2014-11-11: 1000 mL via INTRAVENOUS

## 2014-11-11 MED ORDER — DEXTROSE 5 % IV SOLN
1.0000 g | Freq: Once | INTRAVENOUS | Status: AC
  Administered 2014-11-11: 1 g via INTRAVENOUS
  Filled 2014-11-11: qty 10

## 2014-11-11 MED ORDER — IOHEXOL 300 MG/ML  SOLN
100.0000 mL | Freq: Once | INTRAMUSCULAR | Status: AC | PRN
  Administered 2014-11-11: 100 mL via INTRAVENOUS

## 2014-11-11 MED ORDER — ONDANSETRON HCL 4 MG/2ML IJ SOLN
4.0000 mg | Freq: Once | INTRAMUSCULAR | Status: AC
  Administered 2014-11-11: 4 mg via INTRAVENOUS
  Filled 2014-11-11: qty 2

## 2014-11-11 MED ORDER — MORPHINE SULFATE (PF) 4 MG/ML IV SOLN
4.0000 mg | Freq: Once | INTRAVENOUS | Status: AC
  Administered 2014-11-11: 4 mg via INTRAVENOUS
  Filled 2014-11-11: qty 1

## 2014-11-11 MED ORDER — MORPHINE SULFATE (PF) 4 MG/ML IV SOLN
2.0000 mg | Freq: Once | INTRAVENOUS | Status: AC
  Administered 2014-11-11: 2 mg via INTRAVENOUS
  Filled 2014-11-11: qty 1

## 2014-11-11 NOTE — ED Notes (Signed)
Attempts made to give report to ED - attempts unsuccessful.

## 2014-11-11 NOTE — ED Notes (Signed)
C/O RLQ abd pain onset yesterday with n/v.  Unsure if fevers.  Pain progressively getting worse.

## 2014-11-11 NOTE — ED Provider Notes (Signed)
Casey Newton is a 59 y.o. female who presents to Urgent Care today for one day of worsening right lower quadrant abdominal pain. Pain does not radiate. No fevers or chills or diarrhea. Patient had one episode of vomiting yesterday evening none today. She does feel nauseated and has a decreased appetite. She denies any blood in the stool. She denies any urinary frequency urgency or dysuria. She denies a history of appendectomy or cholecystectomy.    Past Medical History  Diagnosis Date  . Hypertension   . OSA on CPAP   . Arthritis     left hip   Past Surgical History  Procedure Laterality Date  . Cesarean section      2 times  . Abdominal hysterectomy      left ovaries  . Bunionectomy      left foot  . Tonsillectomy    . Dental implants     Social History  Substance Use Topics  . Smoking status: Never Smoker   . Smokeless tobacco: Never Used  . Alcohol Use: No   ROS as above Medications: No current facility-administered medications for this encounter.   Current Outpatient Prescriptions  Medication Sig Dispense Refill  . aspirin 81 MG tablet Take 81 mg by mouth daily.    . calcium carbonate 200 MG capsule Take 800 mg by mouth daily.    . clindamycin (CLEOCIN T) 1 % lotion Apply topically 2 (two) times daily.    Marland Kitchen losartan-hydrochlorothiazide (HYZAAR) 100-12.5 MG per tablet Take 1 tablet by mouth daily. 90 tablet 3  . Multiple Vitamin (MULTIVITAMIN) tablet Take 1 tablet by mouth daily.    Marland Kitchen nystatin-triamcinolone ointment (MYCOLOG) Apply 1 application topically 2 (two) times daily.    . Omega-3 Fatty Acids (FISH OIL) 1000 MG CAPS Take by mouth daily.    Marland Kitchen oxymetazoline (AFRIN) 0.05 % nasal spray Place 1 spray into both nostrils 2 (two) times daily.    Marland Kitchen PAPAYA PO Take by mouth as needed.     Allergies  Allergen Reactions  . Penicillins     Upset stomach     Exam:  BP 150/77 mmHg  Pulse 117  Temp(Src) 98 F (36.7 C) (Oral)  Resp 22  SpO2 97% Gen: Well  NAD HEENT: EOMI,  MMM Lungs: Normal work of breathing. CTABL Heart: Tachycardia but regular rhythm no MRG Abd: NABS, Soft. Nondistended, tender palpation right lower quadrant with mild guarding. No rebound. Exts: Brisk capillary refill, warm and well perfused.   No results found for this or any previous visit (from the past 24 hour(s)). No results found.  Assessment and Plan: 59 y.o. female with abdominal pain worsening right lower quadrant. Concerning for serious intra-abdominal etiology. Transfer to ED for further evaluation and management due to the limited nature of our ability to work up this problem in urgent care.  Discussed warning signs or symptoms. Please see discharge instructions. Patient expresses understanding.     Rodolph Bong, MD 11/11/14 [phone]

## 2014-11-11 NOTE — ED Notes (Signed)
MD at bedside. 

## 2014-11-11 NOTE — H&P (Signed)
Casey Newton is an 59 y.o. female.   Chief Complaint: ruq pain HPI: Casey Newton presents with first episode ruq pain after eating lunch yesterday.  Pain has persisted.  No improvement or getting worse.  N/v have occurred.  Presented to er and underwent ct that shows cholecystitis with stone in neck.   Past Medical History  Diagnosis Date  . Hypertension   . OSA on CPAP   . Arthritis     left hip    Past Surgical History  Procedure Laterality Date  . Cesarean section      2 times  . Abdominal hysterectomy      left ovaries  . Bunionectomy      left foot  . Tonsillectomy    . Dental implants      Family History  Problem Relation Age of Onset  . Liver disease Mother   . Leukemia Father    Social History:  reports that Casey Newton has never smoked. Casey Newton has never used smokeless tobacco. Casey Newton reports that Casey Newton does not drink alcohol or use illicit drugs.  Allergies:  Allergies  Allergen Reactions  . Penicillins Nausea Only and Other (See Comments)    Upset stomach    meds reviewed  Results for orders placed or performed during the hospital encounter of 11/11/14 (from the past 48 hour(s))  Lipase, blood     Status: Abnormal   Collection Time: 11/11/14  5:00 PM  Result Value Ref Range   Lipase 18 (L) 22 - 51 U/L  Comprehensive metabolic panel     Status: Abnormal   Collection Time: 11/11/14  5:00 PM  Result Value Ref Range   Sodium 133 (L) 135 - 145 mmol/L   Potassium 3.9 3.5 - 5.1 mmol/L   Chloride 96 (L) 101 - 111 mmol/L   CO2 23 22 - 32 mmol/L   Glucose, Bld 129 (H) 65 - 99 mg/dL   BUN 8 6 - 20 mg/dL   Creatinine, Ser 0.88 0.Casey - 1.00 mg/dL   Calcium 9.1 8.9 - 10.3 mg/dL   Total Protein 7.7 6.5 - 8.1 g/dL   Albumin 4.0 3.5 - 5.0 g/dL   AST 41 15 - 41 U/L   ALT 35 14 - 54 U/L   Alkaline Phosphatase 112 38 - 126 U/L   Total Bilirubin 0.9 0.3 - 1.2 mg/dL   GFR calc non Af Amer >60 >60 mL/min   GFR calc Af Amer >60 >60 mL/min    Comment: (NOTE) The eGFR has been  calculated using the CKD EPI equation. This calculation has not been validated in all clinical situations. eGFR's persistently <60 mL/min signify possible Chronic Kidney Disease.    Anion gap 14 5 - 15  CBC     Status: Abnormal   Collection Time: 11/11/14  5:00 PM  Result Value Ref Range   WBC 19.5 (H) 4.0 - 10.5 K/uL   RBC 4.69 3.87 - 5.11 MIL/uL   Hemoglobin 13.1 12.0 - 15.0 g/dL   HCT 40.0 36.0 - 46.0 %   MCV 85.3 78.0 - 100.0 fL   MCH 27.9 26.0 - 34.0 pg   MCHC 32.8 30.0 - 36.0 g/dL   RDW 14.0 11.5 - 15.5 %   Platelets 268 150 - 400 K/uL  Urinalysis, Routine w reflex microscopic (not at [facility])     Status: Abnormal   Collection Time: 11/11/14  5:34 PM  Result Value Ref Range   Color, Urine AMBER (A) YELLOW    Comment:  BIOCHEMICALS MAY BE AFFECTED BY COLOR   APPearance CLOUDY (A) CLEAR   Specific Gravity, Urine 1.027 1.005 - 1.030   pH 6.0 5.0 - 8.0   Glucose, UA NEGATIVE NEGATIVE mg/dL   Hgb urine dipstick LARGE (A) NEGATIVE   Bilirubin Urine SMALL (A) NEGATIVE   Ketones, ur 15 (A) NEGATIVE mg/dL   Protein, ur 100 (A) NEGATIVE mg/dL   Urobilinogen, UA 1.0 0.0 - 1.0 mg/dL   Nitrite NEGATIVE NEGATIVE   Leukocytes, UA MODERATE (A) NEGATIVE  Urine microscopic-add on     Status: Abnormal   Collection Time: 11/11/14  5:34 PM  Result Value Ref Range   Squamous Epithelial / LPF FEW (A) RARE   WBC, UA 11-20 <3 WBC/hpf   RBC / HPF 3-6 <3 RBC/hpf   Bacteria, UA FEW (A) RARE   Urine-Other MUCOUS PRESENT     Comment: AMORPHOUS URATES/PHOSPHATES   Ct Abdomen Pelvis W Contrast  11/11/2014   CLINICAL DATA:  Right lower quadrant pain, nausea, and vomiting for 1 day.  EXAM: CT ABDOMEN AND PELVIS WITH CONTRAST  TECHNIQUE: Multidetector CT imaging of the abdomen and pelvis was performed using the standard protocol following bolus administration of intravenous contrast.  CONTRAST:  184m OMNIPAQUE IOHEXOL 300 MG/ML  SOLN  COMPARISON:  None.  FINDINGS: Linear atelectasis in the lung bases.   Cholelithiasis with large stone in the gallbladder neck. Gallbladder wall thickening and edema. Stranding in the fat around the gallbladder. Changes are consistent with acute cholecystitis. No bile duct dilatation.  The liver, spleen, pancreas, adrenal glands, abdominal aorta, inferior vena cava, and retroperitoneal lymph nodes are unremarkable. Sub cm low-attenuation lesion in the left kidney is probably a cyst although too small to characterize. Renal nephrograms are symmetrical. No hydronephrosis in either kidney. Stomach is decompressed. Inflammation and wall thickening involving the duodenum. This could represent reactive inflammation coming from the cholecystitis or it may indicate peptic ulcer disease. Small bowel and colon are not abnormally distended. Scattered stool in the colon. No free air or free fluid in the abdomen.  Pelvis: Diverticulosis of the sigmoid colon without evidence of diverticulitis. No free or loculated pelvic fluid collections. The appendix is normal. Bladder wall is not thickened. Uterus is surgically absent. No pelvic mass or lymphadenopathy. No destructive bone lesions.  IMPRESSION: Cholelithiasis with inflammatory changes around the gallbladder consistent with acute cholecystitis. Reactive inflammation versus peptic ulcer disease involving the duodenum.   Electronically Signed   By: WLucienne CapersM.D.   On: 11/11/2014 21:Casey    Review of Systems  Constitutional: Negative for fever and chills.  Respiratory: Negative for shortness of breath.   Cardiovascular: Negative for chest pain.  Gastrointestinal: Positive for nausea, vomiting, abdominal pain and constipation.    Blood pressure 140/72, pulse 97, temperature 100.2 F (37.9 C), temperature source Oral, resp. rate 19, SpO2 96 %. Physical Exam  Vitals reviewed. Constitutional: Casey Newton appears well-developed and well-nourished.  Eyes: No scleral icterus.  Neck: Neck supple.  Cardiovascular: Normal rate, regular rhythm  and normal heart sounds.   Respiratory: Effort normal and breath sounds normal. Casey Newton has no wheezes.  GI: Soft. Bowel sounds are normal. Casey Newton exhibits no distension. There is tenderness in the right upper quadrant. There is positive Murphy's sign. No hernia.     Assessment/Plan Cholecystitis  Plan for admission, iv fluids, start abx (doesnt really have pcn allergy but will give cipro as Casey Newton is already nauseated), plan for lap chole in am  [facility]9/12/2014, 10:48 PM

## 2014-11-11 NOTE — ED Provider Notes (Signed)
CSN: [id number]     Arrival date & time 11/11/14  1620 History   First MD Initiated Contact with Patient 11/11/14 1932     Chief Complaint  Patient presents with  . Abdominal Pain   Casey Newton is a 59 y.o. female with a history of hypertension, and partial hysterectomy who presents to the emergency department complaining of right lower quadrant abdominal pain since yesterday evening. Currently the patient complains of 7 out of 10 ache he right lower quadrant abdominal pain. Patient presented yesterday she is very nauseated and vomited once. She reports she has been nauseated and had decreased appetite. Currently she reports her nausea has improved and she is feeling hungry. She reports this abdominal pain has persisted. She reports subjective fever starting today. Patient's previous abdominal surgical history includes partial hysterectomy. She reports both of her ovaries are remaining. The patient reports she has not vomited today. The patient denies urinary symptoms, hematuria, dysuria, diarrhea, chest pain, shortness of breath, lightheadedness, hematemesis, or hematochezia. She denies history of appendectomy or cholecystectomy. She denies history of diverticulitis. The patient last ate at 10:30 this morning.  (Consider location/radiation/quality/duration/timing/severity/associated sxs/prior Treatment) HPI  Past Medical History  Diagnosis Date  . Hypertension   . OSA on CPAP   . Arthritis     left hip   Past Surgical History  Procedure Laterality Date  . Cesarean section      2 times  . Abdominal hysterectomy      left ovaries  . Bunionectomy      left foot  . Tonsillectomy    . Dental implants     Family History  Problem Relation Age of Onset  . Liver disease Mother   . Leukemia Father    Social History  Substance Use Topics  . Smoking status: Never Smoker   . Smokeless tobacco: Never Used  . Alcohol Use: No   OB History    No data available     Review of  Systems  Constitutional: Positive for fever and appetite change. Negative for chills.  HENT: Negative for congestion and sore throat.   Eyes: Negative for visual disturbance.  Respiratory: Negative for cough and shortness of breath.   Cardiovascular: Negative for chest pain and palpitations.  Gastrointestinal: Positive for nausea, vomiting and abdominal pain. Negative for diarrhea and blood in stool.  Genitourinary: Negative for dysuria, urgency, frequency, hematuria, vaginal bleeding, vaginal discharge and difficulty urinating.  Musculoskeletal: Negative for back pain and neck pain.  Skin: Negative for rash.  Neurological: Negative for weakness, light-headedness and headaches.      Allergies  Penicillins  Home Medications   Prior to Admission medications   Medication Sig Start Date End Date Taking? Authorizing Provider  aspirin 81 MG tablet Take 81 mg by mouth daily.   Yes Historical Provider, MD  calcium carbonate 200 MG capsule Take 800 mg by mouth daily.   Yes Historical Provider, MD  losartan-hydrochlorothiazide (HYZAAR) 100-12.5 MG per tablet Take 1 tablet by mouth daily. 09/14/14  Yes Judie Bonus, MD  Multiple Vitamin (MULTIVITAMIN) tablet Take 1 tablet by mouth daily.   Yes Historical Provider, MD  Omega-3 Fatty Acids (FISH OIL) 1000 MG CAPS Take 1,000 mg by mouth daily.    Yes Historical Provider, MD  PAPAYA PO Take 1 tablet by mouth as needed (for heartburn).    Yes Historical Provider, MD   BP 143/69 mmHg  Pulse 79  Temp(Src) 98.8 F (37.1 C) (Axillary)  Resp 19  Ht 5\' 1"  (1.549 m)  Wt 214 lb (97.07 kg)  BMI 40.46 kg/m2  SpO2 94% Physical Exam  Constitutional: She appears well-developed and well-nourished. No distress.  Nontoxic appearing.  HENT:  Head: Normocephalic and atraumatic.  Mouth/Throat: Oropharynx is clear and moist.  Eyes: Conjunctivae are normal. Pupils are equal, round, and reactive to light. Right eye exhibits no discharge. Left eye exhibits  no discharge.  Neck: Neck supple.  Cardiovascular: Normal rate, regular rhythm, normal heart sounds and intact distal pulses.  Exam reveals no gallop and no friction rub.   No murmur heard. Heart rate is 96. Bilateral radial pulses are intact.  Pulmonary/Chest: Effort normal and breath sounds normal. No respiratory distress. She has no wheezes. She has no rales.  Lungs are clear to auscultation bilaterally.  Abdominal: Soft. She exhibits no distension and no mass. There is tenderness. There is guarding. There is no rebound.  Patient has right upper and lower quadrant tenderness to palpation mild guarding. Negative Rovsing sign. Negative psoas and obturator sign. Abdomen is soft. Bowel sounds are hypoactive. No CVA or flank tenderness.  Musculoskeletal: She exhibits no edema.  Lymphadenopathy:    She has no cervical adenopathy.  Neurological: She is alert. Coordination normal.  Skin: Skin is warm and dry. No rash noted. She is not diaphoretic. No erythema. No pallor.  Psychiatric: She has a normal mood and affect. Her behavior is normal.  Nursing note and vitals reviewed.   ED Course  Procedures (including critical care time) Labs Review Labs Reviewed  LIPASE, BLOOD - Abnormal; Notable for the following:    Lipase 18 (*)    All other components within normal limits  COMPREHENSIVE METABOLIC PANEL - Abnormal; Notable for the following:    Sodium 133 (*)    Chloride 96 (*)    Glucose, Bld 129 (*)    All other components within normal limits  CBC - Abnormal; Notable for the following:    WBC 19.5 (*)    All other components within normal limits  URINALYSIS, ROUTINE W REFLEX MICROSCOPIC (NOT AT [facility]) - Abnormal; Notable for the following:    Color, Urine AMBER (*)    APPearance CLOUDY (*)    Hgb urine dipstick LARGE (*)    Bilirubin Urine SMALL (*)    Ketones, ur 15 (*)    Protein, ur 100 (*)    Leukocytes, UA MODERATE (*)    All other components within normal limits  URINE  MICROSCOPIC-ADD ON - Abnormal; Notable for the following:    Squamous Epithelial / LPF FEW (*)    Bacteria, UA FEW (*)    All other components within normal limits  SURGICAL PCR SCREEN  URINE CULTURE  SURGICAL PATHOLOGY    Imaging Review Ct Abdomen Pelvis W Contrast  11/11/2014   CLINICAL DATA:  Right lower quadrant pain, nausea, and vomiting for 1 day.  EXAM: CT ABDOMEN AND PELVIS WITH CONTRAST  TECHNIQUE: Multidetector CT imaging of the abdomen and pelvis was performed using the standard protocol following bolus administration of intravenous contrast.  CONTRAST:  OMNIPAQUE IOHEXOL 300 MG/ML  SOLN  COMPARISON:  None.  FINDINGS: Linear atelectasis in the lung bases.  Cholelithiasis with large stone in the gallbladder neck. Gallbladder wall thickening and edema. Stranding in the fat around the gallbladder. Changes are consistent with acute cholecystitis. No bile duct dilatation.  The liver, spleen, pancreas, adrenal glands, abdominal aorta, inferior vena cava, and retroperitoneal lymph nodes are unremarkable. Sub cm low-attenuation lesion in  the left kidney is probably a cyst although too small to characterize. Renal nephrograms are symmetrical. No hydronephrosis in either kidney. Stomach is decompressed. Inflammation and wall thickening involving the duodenum. This could represent reactive inflammation coming from the cholecystitis or it may indicate peptic ulcer disease. Small bowel and colon are not abnormally distended. Scattered stool in the colon. No free air or free fluid in the abdomen.  Pelvis: Diverticulosis of the sigmoid colon without evidence of diverticulitis. No free or loculated pelvic fluid collections. The appendix is normal. Bladder wall is not thickened. Uterus is surgically absent. No pelvic mass or lymphadenopathy. No destructive bone lesions.  IMPRESSION: Cholelithiasis with inflammatory changes around the gallbladder consistent with acute cholecystitis. Reactive inflammation  versus peptic ulcer disease involving the duodenum.   Electronically Signed   By: Burman Nieves M.D.   On: 11/11/2014 21:44   I have personally reviewed and evaluated these images and lab results as part of my medical decision-making.   EKG Interpretation None      Filed Vitals:   11/12/14 1827 11/12/14 1900 11/12/14 2200 11/12/14 2242  BP: 166/80 146/74  143/69  Pulse: 103  96 79  Temp: 100.1 F (37.8 C)   98.8 F (37.1 C)  TempSrc: Oral   Axillary  Resp: 20   19  Height:      Weight:      SpO2: 94%  94% 94%     MDM   Meds given in ED:  Medications  heparin injection 5,000 Units (5,000 Units Subcutaneous Given 11/12/14 2237)  0.9 %  sodium chloride infusion ( Intravenous New Bag/Given 11/12/14 1832)  acetaminophen (TYLENOL) tablet 650 mg (not administered)    Or  acetaminophen (TYLENOL) suppository 650 mg (not administered)  morphine 2 MG/ML injection 2 mg (2 mg Intravenous Given 11/12/14 1559)  ondansetron (ZOFRAN-ODT) disintegrating tablet 4 mg (not administered)    Or  ondansetron (ZOFRAN) injection 4 mg (not administered)  HYDROmorphone (DILAUDID) injection 0.25-0.5 mg (0.5 mg Intravenous Given 11/12/14 1410)  HYDROcodone-acetaminophen (NORCO/VICODIN) 5-325 MG per tablet 1-2 tablet (2 tablets Oral Given 11/12/14 2237)  losartan (COZAAR) tablet 100 mg (100 mg Oral Given 11/12/14 1553)    And  hydrochlorothiazide (MICROZIDE) capsule 12.5 mg (12.5 mg Oral Given 11/12/14 1553)  sodium chloride 0.9 % bolus 1,000 mL (0 mLs Intravenous Stopped 11/12/14 0105)  morphine 4 MG/ML injection 4 mg (4 mg Intravenous Given 11/11/14 2036)  ondansetron (ZOFRAN) injection 4 mg (4 mg Intravenous Given 11/11/14 2035)  iohexol (OMNIPAQUE) 300 MG/ML solution 100 mL (100 mLs Intravenous Contrast Given 11/11/14 2049)  cefTRIAXone (ROCEPHIN) 1 g in dextrose 5 % 50 mL IVPB (0 g Intravenous Stopped 11/11/14 2345)  morphine 4 MG/ML injection 2 mg (2 mg Intravenous Given 11/11/14 2302)  cefTRIAXone  (ROCEPHIN) 1 g in dextrose 5 % 50 mL IVPB (1 g Intravenous New Bag/Given 11/12/14 1210)    Current Discharge Medication List      Final diagnoses:  Acute cholecystitis   This is a 59 y.o. female with a history of hypertension, and partial hysterectomy who presents to the emergency department complaining of right lower quadrant abdominal pain since yesterday evening. Currently the patient complains of 7 out of 10 ache he right lower quadrant abdominal pain. Patient presented yesterday she is very nauseated and vomited once. On exam the patient has a temperature of 100.2. She is nontoxic appearing. Heart rate is 96. Patient's abdomen is soft and she has tenderness to her right upper and lower  quadrant. Patient has a leukocytosis with a white count of 19.5. Urinalysis also indicates moderate leukocytes with 11-20 white blood cells. Urine sent for culture. Patient given a gram of Rocephin in the ED for urinary tract infection. CT abdomen and pelvis with contrast indicates acute cholecystitis. General surgery Dr. Dwain Sarna consulted. Dr. Dwain Sarna will admit patient. The patient is in agreement with admission.   This patient was discussed with and evaluated by Dr. Lynelle Doctor who agrees with assessment and plan.     Everlene Farrier, PA-C 11/13/14 2130  Linwood Dibbles, MD 11/14/14 [id number]

## 2014-11-11 NOTE — ED Notes (Signed)
Pt reports that she was sent down here from urgent care to rule out an appendicitis. Pt reports that she started experiencing abd pain yesterday and now has pain in the RLQ only. Pt alert x4.

## 2014-11-11 NOTE — ED Notes (Signed)
Pt instructed to be NPO until further notice.  Pt verbalized understanding.

## 2014-11-11 NOTE — ED Notes (Signed)
Report called to [facility], ED First Nurse.

## 2014-11-12 ENCOUNTER — Encounter (HOSPITAL_COMMUNITY): Payer: Self-pay | Admitting: Anesthesiology

## 2014-11-12 ENCOUNTER — Encounter (HOSPITAL_COMMUNITY): Admission: EM | Disposition: A | Discharge status: Home / Self Care | Payer: Self-pay | Source: Home / Self Care

## 2014-11-12 ENCOUNTER — Inpatient Hospital Stay (HOSPITAL_COMMUNITY): Payer: BLUE CROSS/BLUE SHIELD | Admitting: Anesthesiology

## 2014-11-12 ENCOUNTER — Inpatient Hospital Stay: Admit: 2014-11-12 | Payer: Self-pay | Admitting: General Surgery

## 2014-11-12 DIAGNOSIS — Z6841 Body Mass Index (BMI) 40.0 and over, adult: Secondary | ICD-10-CM | POA: Diagnosis not present

## 2014-11-12 DIAGNOSIS — R1011 Right upper quadrant pain: Secondary | ICD-10-CM | POA: Diagnosis present

## 2014-11-12 DIAGNOSIS — K819 Cholecystitis, unspecified: Secondary | ICD-10-CM | POA: Diagnosis present

## 2014-11-12 DIAGNOSIS — Z7982 Long term (current) use of aspirin: Secondary | ICD-10-CM | POA: Diagnosis not present

## 2014-11-12 DIAGNOSIS — K660 Peritoneal adhesions (postprocedural) (postinfection): Secondary | ICD-10-CM | POA: Diagnosis present

## 2014-11-12 DIAGNOSIS — N390 Urinary tract infection, site not specified: Secondary | ICD-10-CM | POA: Diagnosis present

## 2014-11-12 DIAGNOSIS — G4733 Obstructive sleep apnea (adult) (pediatric): Secondary | ICD-10-CM | POA: Diagnosis present

## 2014-11-12 DIAGNOSIS — I10 Essential (primary) hypertension: Secondary | ICD-10-CM | POA: Diagnosis present

## 2014-11-12 DIAGNOSIS — K8000 Calculus of gallbladder with acute cholecystitis without obstruction: Secondary | ICD-10-CM | POA: Diagnosis present

## 2014-11-12 HISTORY — PX: CHOLECYSTECTOMY: SHX55

## 2014-11-12 LAB — SURGICAL PCR SCREEN
MRSA, PCR: NEGATIVE
Staphylococcus aureus: NEGATIVE

## 2014-11-12 SURGERY — LAPAROSCOPIC CHOLECYSTECTOMY WITH INTRAOPERATIVE CHOLANGIOGRAM
Anesthesia: General

## 2014-11-12 MED ORDER — SODIUM CHLORIDE 0.9 % IR SOLN
Status: DC | PRN
  Administered 2014-11-12: 1

## 2014-11-12 MED ORDER — HEPARIN SODIUM (PORCINE) 5000 UNIT/ML IJ SOLN
5000.0000 [IU] | Freq: Three times a day (TID) | INTRAMUSCULAR | Status: DC
  Administered 2014-11-12 – 2014-11-13 (×4): 5000 [IU] via SUBCUTANEOUS
  Filled 2014-11-12 (×4): qty 1

## 2014-11-12 MED ORDER — LOSARTAN POTASSIUM-HCTZ 100-12.5 MG PO TABS: 1.0000 | ORAL_TABLET | Freq: Every day | ORAL | Status: DC

## 2014-11-12 MED ORDER — ONDANSETRON HCL 4 MG/2ML IJ SOLN
INTRAMUSCULAR | Status: DC | PRN
  Administered 2014-11-12: 4 mg via INTRAVENOUS

## 2014-11-12 MED ORDER — ROCURONIUM BROMIDE 100 MG/10ML IV SOLN
INTRAVENOUS | Status: DC | PRN
  Administered 2014-11-12: 30 mg via INTRAVENOUS

## 2014-11-12 MED ORDER — HYDROCODONE-ACETAMINOPHEN 5-325 MG PO TABS
1.0000 | ORAL_TABLET | ORAL | Status: DC | PRN
  Administered 2014-11-12 – 2014-11-13 (×4): 2 via ORAL
  Filled 2014-11-12 (×4): qty 2

## 2014-11-12 MED ORDER — NEOSTIGMINE METHYLSULFATE 10 MG/10ML IV SOLN
INTRAVENOUS | Status: DC | PRN
  Administered 2014-11-12: 2.5 mg via INTRAVENOUS

## 2014-11-12 MED ORDER — ACETAMINOPHEN 325 MG PO TABS: 650.0000 mg | ORAL_TABLET | Freq: Four times a day (QID) | ORAL | Status: DC | PRN

## 2014-11-12 MED ORDER — DEXTROSE 5 % IV SOLN
1.0000 g | Freq: Once | INTRAVENOUS | Status: AC
  Administered 2014-11-12: 1 g via INTRAVENOUS
  Filled 2014-11-12 (×2): qty 10

## 2014-11-12 MED ORDER — MIDAZOLAM HCL 5 MG/5ML IJ SOLN
INTRAMUSCULAR | Status: DC | PRN
  Administered 2014-11-12: 2 mg via INTRAVENOUS

## 2014-11-12 MED ORDER — BUPIVACAINE-EPINEPHRINE 0.25% -1:200000 IJ SOLN
INTRAMUSCULAR | Status: DC | PRN
  Administered 2014-11-12: 20 mL

## 2014-11-12 MED ORDER — LIDOCAINE HCL (CARDIAC) 20 MG/ML IV SOLN
INTRAVENOUS | Status: DC | PRN
  Administered 2014-11-12: 60 mg via INTRAVENOUS

## 2014-11-12 MED ORDER — ACETAMINOPHEN 650 MG RE SUPP: 650.0000 mg | Freq: Four times a day (QID) | RECTAL | Status: DC | PRN

## 2014-11-12 MED ORDER — PROPOFOL 10 MG/ML IV BOLUS
INTRAVENOUS | Status: AC
  Filled 2014-11-12: qty 20

## 2014-11-12 MED ORDER — ONDANSETRON 4 MG PO TBDP
4.0000 mg | ORAL_TABLET | Freq: Four times a day (QID) | ORAL | Status: DC | PRN
Start: 2014-11-12 — End: 2014-11-13

## 2014-11-12 MED ORDER — PROPOFOL 10 MG/ML IV BOLUS
INTRAVENOUS | Status: DC | PRN
  Administered 2014-11-12: 130 mg via INTRAVENOUS
  Administered 2014-11-12: 20 mg via INTRAVENOUS

## 2014-11-12 MED ORDER — ARTIFICIAL TEARS OP OINT
TOPICAL_OINTMENT | OPHTHALMIC | Status: DC | PRN
  Administered 2014-11-12: 1 via OPHTHALMIC

## 2014-11-12 MED ORDER — BUPIVACAINE-EPINEPHRINE (PF) 0.25% -1:200000 IJ SOLN
INTRAMUSCULAR | Status: AC
  Filled 2014-11-12: qty 30

## 2014-11-12 MED ORDER — SODIUM CHLORIDE 0.9 % IV SOLN
INTRAVENOUS | Status: DC
  Administered 2014-11-12 – 2014-11-13 (×4): via INTRAVENOUS

## 2014-11-12 MED ORDER — SUCCINYLCHOLINE CHLORIDE 20 MG/ML IJ SOLN
INTRAMUSCULAR | Status: DC | PRN
  Administered 2014-11-12: 100 mg via INTRAVENOUS

## 2014-11-12 MED ORDER — GLYCOPYRROLATE 0.2 MG/ML IJ SOLN
INTRAMUSCULAR | Status: DC | PRN
  Administered 2014-11-12: 0.3 mg via INTRAVENOUS

## 2014-11-12 MED ORDER — HYDROMORPHONE HCL 1 MG/ML IJ SOLN
INTRAMUSCULAR | Status: AC
  Administered 2014-11-12: 0.5 mg via INTRAVENOUS
  Filled 2014-11-12: qty 1

## 2014-11-12 MED ORDER — FENTANYL CITRATE (PF) 100 MCG/2ML IJ SOLN
INTRAMUSCULAR | Status: DC | PRN
  Administered 2014-11-12 (×5): 50 ug via INTRAVENOUS

## 2014-11-12 MED ORDER — MORPHINE SULFATE (PF) 2 MG/ML IV SOLN
2.0000 mg | INTRAVENOUS | Status: DC | PRN
Start: 2014-11-12 — End: 2014-11-13
  Administered 2014-11-12 – 2014-11-13 (×4): 2 mg via INTRAVENOUS
  Filled 2014-11-12 (×4): qty 1

## 2014-11-12 MED ORDER — HYDROMORPHONE HCL 1 MG/ML IJ SOLN
0.2500 mg | INTRAMUSCULAR | Status: DC | PRN
Start: 2014-11-12 — End: 2014-11-13
  Administered 2014-11-12 (×2): 0.5 mg via INTRAVENOUS

## 2014-11-12 MED ORDER — CIPROFLOXACIN IN D5W 400 MG/200ML IV SOLN
400.0000 mg | Freq: Two times a day (BID) | INTRAVENOUS | Status: DC
  Administered 2014-11-12: 400 mg via INTRAVENOUS
  Filled 2014-11-12 (×3): qty 200

## 2014-11-12 MED ORDER — HYDROCHLOROTHIAZIDE 12.5 MG PO CAPS
12.5000 mg | ORAL_CAPSULE | Freq: Every day | ORAL | Status: DC
  Administered 2014-11-12 – 2014-11-13 (×2): 12.5 mg via ORAL
  Filled 2014-11-12 (×2): qty 1

## 2014-11-12 MED ORDER — LOSARTAN POTASSIUM 50 MG PO TABS
100.0000 mg | ORAL_TABLET | Freq: Every day | ORAL | Status: DC
  Administered 2014-11-12 – 2014-11-13 (×2): 100 mg via ORAL
  Filled 2014-11-12 (×2): qty 2

## 2014-11-12 MED ORDER — ONDANSETRON HCL 4 MG/2ML IJ SOLN: 4.0000 mg | Freq: Four times a day (QID) | INTRAMUSCULAR | Status: DC | PRN

## 2014-11-12 MED ORDER — DEXAMETHASONE SODIUM PHOSPHATE 4 MG/ML IJ SOLN
INTRAMUSCULAR | Status: DC | PRN
  Administered 2014-11-12: 4 mg via INTRAVENOUS

## 2014-11-12 MED ORDER — FENTANYL CITRATE (PF) 250 MCG/5ML IJ SOLN
INTRAMUSCULAR | Status: AC
  Filled 2014-11-12: qty 5

## 2014-11-12 MED ORDER — MIDAZOLAM HCL 2 MG/2ML IJ SOLN
INTRAMUSCULAR | Status: AC
  Filled 2014-11-12: qty 4

## 2014-11-12 MED ORDER — LACTATED RINGERS IV SOLN
INTRAVENOUS | Status: DC | PRN
  Administered 2014-11-12 (×2): via INTRAVENOUS

## 2014-11-12 SURGICAL SUPPLY — 43 items
ADH SKN CLS APL DERMABOND .7 (GAUZE/BANDAGES/DRESSINGS) ×1
APPLIER CLIP 5 13 M/L LIGAMAX5 (MISCELLANEOUS) ×2
APR CLP MED LRG 5 ANG JAW (MISCELLANEOUS) ×1
BAG SPEC RTRVL 10 TROC 200 (ENDOMECHANICALS) ×1
BAG SPEC RTRVL LRG 6X4 10 (ENDOMECHANICALS)
BLADE SURG ROTATE 9660 (MISCELLANEOUS) ×1 IMPLANT
CANISTER SUCTION 2500CC (MISCELLANEOUS) ×2 IMPLANT
CHLORAPREP W/TINT 26ML (MISCELLANEOUS) ×2 IMPLANT
CLIP APPLIE 5 13 M/L LIGAMAX5 (MISCELLANEOUS) ×1 IMPLANT
CLSR STERI-STRIP ANTIMIC 1/2X4 (GAUZE/BANDAGES/DRESSINGS) ×1 IMPLANT
COVER MAYO STAND STRL (DRAPES) IMPLANT
COVER SURGICAL LIGHT HANDLE (MISCELLANEOUS) ×2 IMPLANT
DERMABOND ADVANCED (GAUZE/BANDAGES/DRESSINGS) ×1
DERMABOND ADVANCED .7 DNX12 (GAUZE/BANDAGES/DRESSINGS) ×1 IMPLANT
DRAPE C-ARM 42X72 X-RAY (DRAPES) IMPLANT
DRSG TEGADERM 2-3/8X2-3/4 SM (GAUZE/BANDAGES/DRESSINGS) ×5 IMPLANT
ELECT REM PT RETURN 9FT ADLT (ELECTROSURGICAL) ×2
ELECTRODE REM PT RTRN 9FT ADLT (ELECTROSURGICAL) ×1 IMPLANT
GLOVE BIOGEL PI IND STRL 8 (GLOVE) ×1 IMPLANT
GLOVE BIOGEL PI INDICATOR 8 (GLOVE) ×1
GLOVE ECLIPSE 7.5 STRL STRAW (GLOVE) ×2 IMPLANT
GOWN STRL REUS W/ TWL LRG LVL3 (GOWN DISPOSABLE) ×3 IMPLANT
GOWN STRL REUS W/TWL LRG LVL3 (GOWN DISPOSABLE) ×6
KIT BASIN OR (CUSTOM PROCEDURE TRAY) ×2 IMPLANT
KIT ROOM TURNOVER OR (KITS) ×2 IMPLANT
NS IRRIG 1000ML POUR BTL (IV SOLUTION) ×2 IMPLANT
PAD ARMBOARD 7.5X6 YLW CONV (MISCELLANEOUS) ×2 IMPLANT
POUCH RETRIEVAL ECOSAC 10 (ENDOMECHANICALS) IMPLANT
POUCH RETRIEVAL ECOSAC 10MM (ENDOMECHANICALS) ×1
POUCH SPECIMEN RETRIEVAL 10MM (ENDOMECHANICALS) IMPLANT
SCISSORS LAP 5X35 DISP (ENDOMECHANICALS) ×2 IMPLANT
SET CHOLANGIOGRAPH 5 50 .035 (SET/KITS/TRAYS/PACK) IMPLANT
SET IRRIG TUBING LAPAROSCOPIC (IRRIGATION / IRRIGATOR) ×2 IMPLANT
SLEEVE ENDOPATH XCEL 5M (ENDOMECHANICALS) ×4 IMPLANT
SPECIMEN JAR SMALL (MISCELLANEOUS) ×2 IMPLANT
STRIP CLOSURE SKIN 1/2X4 (GAUZE/BANDAGES/DRESSINGS) ×2 IMPLANT
SUT MNCRL AB 4-0 PS2 18 (SUTURE) ×2 IMPLANT
TOWEL OR 17X24 6PK STRL BLUE (TOWEL DISPOSABLE) ×2 IMPLANT
TOWEL OR 17X26 10 PK STRL BLUE (TOWEL DISPOSABLE) ×2 IMPLANT
TRAY LAPAROSCOPIC MC (CUSTOM PROCEDURE TRAY) ×2 IMPLANT
TROCAR XCEL BLUNT TIP 100MML (ENDOMECHANICALS) ×2 IMPLANT
TROCAR XCEL NON-BLD 5MMX100MML (ENDOMECHANICALS) ×2 IMPLANT
TUBING INSUFFLATION (TUBING) ×2 IMPLANT

## 2014-11-12 NOTE — Anesthesia Preprocedure Evaluation (Addendum)
Anesthesia Evaluation  Patient identified by MRN, date of birth, ID band Patient awake    Reviewed: Allergy & Precautions, H&P , NPO status , Patient's Chart, lab work & pertinent test results  Airway Mallampati: III  TM Distance: >3 FB Neck ROM: Full    Dental no notable dental hx. (+) Teeth Intact, Dental Advisory Given   Pulmonary sleep apnea and Continuous Positive Airway Pressure Ventilation ,    Pulmonary exam normal breath sounds clear to auscultation       Cardiovascular hypertension, Pt. on medications  Rhythm:Regular Rate:Normal     Neuro/Psych negative neurological ROS  negative psych ROS   GI/Hepatic negative GI ROS, Neg liver ROS,   Endo/Other  Morbid obesity  Renal/GU negative Renal ROS  negative genitourinary   Musculoskeletal  (+) Arthritis , Osteoarthritis,    Abdominal   Peds  Hematology negative hematology ROS (+)   Anesthesia Other Findings   Reproductive/Obstetrics negative OB ROS                            Anesthesia Physical Anesthesia Plan  ASA: III  Anesthesia Plan: General   Post-op Pain Management:    Induction: Intravenous  Airway Management Planned: Oral ETT  Additional Equipment:   Intra-op Plan:   Post-operative Plan: Extubation in OR  Informed Consent: I have reviewed the patients History and Physical, chart, labs and discussed the procedure including the risks, benefits and alternatives for the proposed anesthesia with the patient or authorized representative who has indicated his/her understanding and acceptance.   Dental advisory given  Plan Discussed with: CRNA  Anesthesia Plan Comments:         Anesthesia Quick Evaluation

## 2014-11-12 NOTE — Transfer of Care (Signed)
Immediate Anesthesia Transfer of Care Note  Patient: Casey Newton  Procedure(s) Performed: Procedure(s): LAPAROSCOPIC CHOLECYSTECTOMY (N/A)  Patient Location: PACU  Anesthesia Type:General  Level of Consciousness: oriented, sedated, patient cooperative and responds to stimulation  Airway & Oxygen Therapy: Patient Spontanous Breathing and Patient connected to face mask oxygen  Post-op Assessment: Report given to RN, Post -op Vital signs reviewed and stable, Patient moving all extremities and Patient moving all extremities X 4  Post vital signs: Reviewed and stable  Last Vitals:  Filed Vitals:   11/12/14 0954  BP: 143/71  Pulse: 79  Temp: 37.8 C  Resp: 14    Complications: No apparent anesthesia complications

## 2014-11-12 NOTE — Op Note (Signed)
OPERATIVE REPORT  DATE OF OPERATION:  11/12/2014  PATIENT:  Casey Newton  59 y.o. female  PRE-OPERATIVE DIAGNOSIS:  ACUTE OLECYSTITIS  POST-OPERATIVE DIAGNOSIS:  ACUTE CHOLECYSTITIS  PROCEDURE:  Procedure(s): LAPAROSCOPIC CHOLECYSTECTOMY  SURGEON:  Surgeon(s): Jimmye Norman, MD  ASSISTANT: None  ANESTHESIA:   general  EBL: 75 ml  BLOOD ADMINISTERED: none  DRAINS: none   SPECIMEN:  Source of Specimen:  Gallbladder and contents  COUNTS CORRECT:  YES  PROCEDURE DETAILS: The patient was taken to the operating room and placed on the table in the supine position.  After an adequate endotracheal anesthetic was administered, the patient was prepped with ChloroPrep, and then draped in the usual manner exposing the entire abdomen laterally, inferiorly and up  to the costal margins.  After a proper timeout was performed including identifying the patient and the procedure to be performed, a Supraumbilical 1.5cm midline incision was made using a #15 blade.  This was taken down to the fascia which was then incised with a #15 blade.  The edges of the fascia were tented up with Kocher clamps as the preperitoneal space was penetrated with a Kelly clamp into the peritoneum.  Once this was done, a pursestring suture of 0 Vicryl was passed around the fascial opening.  This was subsequently used to secure the [facility] cannula which was passed into the peritoneal cavity.  Once the [facility] cannula was in place, carbon dioxide gas was insufflated into the peritoneal cavity up to a maximal intra-abdominal pressure of 15mm Hg.The laparoscope, with attached camera and light source, was passed into the peritoneal cavity to visualize the direct insertion of two right upper quadrant 5mm cannulas, and a sup-xiphoid 5mm cannula.  Once all cannulas were in place, the dissection was begun.  The patient had a markedly inflamed gallbladder with omental adhesions and part of the transverse colon attached to it. She  also had adhesions and scar tissue from the liver capsule to the anterior abdominal wall. All these were taken down using electrocautery, electrocautery attached to scissors laparoscopically freeing up the gallbladder in its bed.  The gallbladder was markedly thickened and had to be decompressed prior to being able to be mobilized and retracted with the graspers. This is down with a Nzjat aspirator we subsequently retracted the gallbladder adequately for dissection.  Two ratcheted graspers were attached to the dome and infundibulum of the gallbladder and retracted towards the anterior abdominal wall and the right upper quadrant.  Using cautery attached to a dissecting forceps, the peritoneum overlaying the triangle of Chalot and the hepatoduodenal triangle was dissected away exposing the cystic duct and the cystic artery.  A critical window was developed between the CBD and the cystic duct The cystic artery was never separately identified and likely coursed along with the cystic duct.  A clip was placed on the gallbladder side of the cystic duct, then  the distal cystic duct was clipped multiple times then transected between the clips.  The gallbladder was then dissected out of the hepatic bed with some difficulty because of the intense inflammation and the markedly distended gallbladder..  It was retrieved from the abdomen using a back able to tolerate significant force and pulling this very large gallstone which is approximately 5 cm in diameter through the opening. The regional stitch place in order to hold the [facility] cannula and in place had to be replaced with a running 0 Vicryl stitch to close the supraumbilical fascial site.  We irrigated with 2 L of  warm saline solution. Once the gallbladder was removed, the bed was inspected for hemostasis.  Once excellent hemostasis was obtained all gas and fluids were aspirated from above the liver, then the cannulas were removed.  The supraumbilical incision was  closed using the pursestring suture which was in place.  0.25% bupivicaine with epinephrine was injected at all sites.  All 10mm or greater cannula sites were close using a running subcuticular stitch of 4-0 Monocryl.  5.75mm cannula sites were closed with Dermabond only.Steri-Strips and Tagaderm were used to complete the dressings at all sites.  At this point all needle, sponge, and instrument counts were correct.The patient was awakened from anesthesia and taken to the PACU in stable condition.   PATIENT DISPOSITION:  PACU - hemodynamically stable.   Casey Newton 9/11/20161:17 PM

## 2014-11-12 NOTE — ED Notes (Signed)
Transporting Patient to New room assignment.  

## 2014-11-12 NOTE — Anesthesia Procedure Notes (Signed)
Procedure Name: Intubation Date/Time: 11/12/2014 11:36 AM Performed by: Wray Kearns A Pre-anesthesia Checklist: Patient identified, Timeout performed, Emergency Drugs available, Suction available and Patient being monitored Patient Re-evaluated:Patient Re-evaluated prior to inductionOxygen Delivery Method: Circle system utilized Preoxygenation: Pre-oxygenation with 100% oxygen Intubation Type: IV induction and Rapid sequence Ventilation: Mask ventilation without difficulty Laryngoscope Size: Mac, 4 and Glidescope Grade View: Grade II Tube type: Oral Tube size: 7.5 mm Number of attempts: 1 Airway Equipment and Method: Rigid stylet and Video-laryngoscopy Placement Confirmation: ETT inserted through vocal cords under direct vision,  breath sounds checked- equal and bilateral and positive ETCO2 Secured at: 21 cm Tube secured with: Tape Dental Injury: Teeth and Oropharynx as per pre-operative assessment

## 2014-11-12 NOTE — Anesthesia Postprocedure Evaluation (Signed)
  Anesthesia Post-op Note  Patient: Casey Newton  Procedure(s) Performed: Procedure(s): LAPAROSCOPIC CHOLECYSTECTOMY (N/A)  Patient Location: PACU  Anesthesia Type:General  Level of Consciousness: awake and alert   Airway and Oxygen Therapy: Patient Spontanous Breathing  Post-op Pain: Controlled  Post-op Assessment: Post-op Vital signs reviewed, Patient's Cardiovascular Status Stable and Respiratory Function Stable  Post-op Vital Signs: Reviewed  Filed Vitals:   11/12/14 1450  BP: 147/84  Pulse: 83  Temp:   Resp:     Complications: No apparent anesthesia complications

## 2014-11-13 ENCOUNTER — Encounter (HOSPITAL_COMMUNITY): Payer: Self-pay | Admitting: General Surgery

## 2014-11-13 MED ORDER — HYDROCODONE-ACETAMINOPHEN 5-325 MG PO TABS: 1.0000 | ORAL_TABLET | ORAL | Status: DC | PRN

## 2014-11-13 MED ORDER — POLYETHYLENE GLYCOL 3350 17 G PO PACK
17.0000 g | PACK | Freq: Every day | ORAL | Status: DC
  Administered 2014-11-13: 17 g via ORAL
  Filled 2014-11-13: qty 1

## 2014-11-13 NOTE — Discharge Summary (Signed)
  Patient ID: Casey Newton MRN: [id number] DOB/AGE: 04-02-1955 59 y.o.  Admit date: 11/11/2014 Discharge date: 11/13/2014  Procedures: lap chole by Dr. Lindie Spruce  Consults: None  Reason for Admission: 33 yof presents with first episode ruq pain after eating lunch yesterday. Pain has persisted. No improvement or getting worse. N/v have occurred. Presented to er and underwent ct that shows cholecystitis with stone in neck.   Admission Diagnoses:  1. Acute cholecystitis  Hospital Course: The patient was admitted and taken to the OR the following day where she underwent a lap chole.  She tolerated this well.  On POD 1, she was tolerating a regular diet and her pain was well controlled.  She was stable for dc home at this time.  PE: Abd: soft, appropriately tender, +BS, ND, obese, incisions c/d/i  Discharge Diagnoses:  Active Problems:   Cholecystitis s/p lap chole  Discharge Medications:   Medication List    TAKE these medications        aspirin 81 MG tablet  Take 81 mg by mouth daily.     calcium carbonate 200 MG capsule  Take 800 mg by mouth daily.     Fish Oil 1000 MG Caps  Take 1,000 mg by mouth daily.     HYDROcodone-acetaminophen 5-325 MG per tablet  Commonly known as:  NORCO/VICODIN  Take 1-2 tablets by mouth every 4 (four) hours as needed for moderate pain.     losartan-hydrochlorothiazide 100-12.5 MG per tablet  Commonly known as:  HYZAAR  Take 1 tablet by mouth daily.     multivitamin tablet  Take 1 tablet by mouth daily.     PAPAYA PO  Take 1 tablet by mouth as needed (for heartburn).        Discharge Instructions:     Follow-up Information    Follow up with [facility] On 11/28/2014.   Specialty:  General Surgery   Why:  [facility], 3:45pm, arrive no later than 3:15pm for paperwork   Contact information:   [address] [place] [address] [phone]       Signed: Letha Cape 11/13/2014,  10:39 AM

## 2014-11-13 NOTE — Progress Notes (Signed)
Casey Newton to be D/C'd Home per MD order.  Discussed with the patient and all questions fully answered.  VSS, Skin clean, dry and intact without evidence of skin break down, no evidence of skin tears noted. IV catheter discontinued intact. Site without signs and symptoms of complications. Dressing and pressure applied.  An After Visit Summary was printed and given to the patient. Patient received prescription.  D/c education completed with patient/family including follow up instructions, medication list, d/c activities limitations if indicated, with other d/c instructions as indicated by MD - patient able to verbalize understanding, all questions fully answered.   Patient instructed to return to ED, call 911, or call MD for any changes in condition.   Patient will be escorted via WC, and D/C home via private auto.  Sherene Sires 11/13/2014 3:09 PM

## 2014-11-13 NOTE — Discharge Instructions (Signed)
[facility] ______[facility] °LAPAROSCOPIC SURGERY: POST OP INSTRUCTIONS °Always review your discharge instruction sheet given to you by the facility where your surgery was performed. °IF YOU HAVE DISABILITY OR FAMILY LEAVE FORMS, YOU MUST BRING THEM TO THE OFFICE FOR PROCESSING.   °DO NOT GIVE THEM TO YOUR DOCTOR. ° °1. A prescription for pain medication may be given to you upon discharge.  Take your pain medication as prescribed, if needed.  If narcotic pain medicine is not needed, then you may take acetaminophen (Tylenol) or ibuprofen (Advil) as needed. °2. Take your usually prescribed medications unless otherwise directed. °3. If you need a refill on your pain medication, please contact your pharmacy.  They will contact our office to request authorization. Prescriptions will not be filled after 5pm or on week-ends. °4. You should follow a light diet the first few days after arrival home, such as soup and crackers, etc.  Be sure to include lots of fluids daily. °5. Most patients will experience some swelling and bruising in the area of the incisions.  Ice packs will help.  Swelling and bruising can take several days to resolve.  °6. It is common to experience some constipation if taking pain medication after surgery.  Increasing fluid intake and taking a stool softener (such as Colace) will usually help or prevent this problem from occurring.  A mild laxative (Milk of Magnesia or Miralax) should be taken according to package instructions if there are no bowel movements after 48 hours. °7. Unless discharge instructions indicate otherwise, you may remove your bandages 24-48 hours after surgery, and you may shower at that time.  You may have steri-strips (small skin tapes) in place directly over the incision.  These strips should be left on the skin for 7-10 days.  If your surgeon used skin glue on the incision, you may shower in 24 hours.  The glue will flake off over the next 2-3 weeks.  Any sutures or  staples will be removed at the office during your follow-up visit. °8. ACTIVITIES:  You may resume regular (light) daily activities beginning the next day--such as daily self-care, walking, climbing stairs--gradually increasing activities as tolerated.  You may have sexual intercourse when it is comfortable.  Refrain from any heavy lifting or straining until approved by your doctor. °a. You may drive when you are no longer taking prescription pain medication, you can comfortably wear a seatbelt, and you can safely maneuver your car and apply brakes. °b. RETURN TO WORK:  __________________________________________________________ °9. You should see your doctor in the office for a follow-up appointment approximately 2-3 weeks after your surgery.  Make sure that you call for this appointment within a day or two after you arrive home to insure a convenient appointment time. °10. OTHER INSTRUCTIONS: __________________________________________________________________________________________________________________________ __________________________________________________________________________________________________________________________ °WHEN TO CALL YOUR DOCTOR: °1. Fever over 101.0 °2. Inability to urinate °3. Continued bleeding from incision. °4. Increased pain, redness, or drainage from the incision. °5. Increasing abdominal pain ° °The clinic staff is available to answer your questions during regular business hours.  Please don’t hesitate to call and ask to speak to one of the nurses for clinical concerns.  If you have a medical emergency, go to the nearest emergency room or call 911.  A surgeon from [facility] is always on call at the hospital. °[address], [address], [place], [place]  [address] ? [address] [address], [place], [place]   [address] °[phone] ? [phone] ? FAX [phone] °Web site:   [email or web address] °

## 2014-11-14 LAB — URINE CULTURE: CULTURE: NO GROWTH

## 2015-04-13 ENCOUNTER — Ambulatory Visit (INDEPENDENT_AMBULATORY_CARE_PROVIDER_SITE_OTHER): Payer: BLUE CROSS/BLUE SHIELD | Admitting: Nurse Practitioner

## 2015-04-13 VITALS — BP 106/70 | HR 83 | Temp 99.1°F | Resp 18 | Wt 211.0 lb

## 2015-04-13 DIAGNOSIS — J069 Acute upper respiratory infection, unspecified: Secondary | ICD-10-CM

## 2015-04-13 MED ORDER — AZITHROMYCIN 250 MG PO TABS: ORAL_TABLET | ORAL | Status: DC

## 2015-04-13 MED ORDER — BENZONATATE 100 MG PO CAPS: 100.0000 mg | ORAL_CAPSULE | Freq: Two times a day (BID) | ORAL | Status: DC | PRN

## 2015-04-13 MED ORDER — FLUTICASONE PROPIONATE 50 MCG/ACT NA SUSP: 2.0000 | Freq: Every day | NASAL | Status: DC

## 2015-04-13 MED ORDER — PROMETHAZINE-DM 6.25-15 MG/5ML PO SYRP
5.0000 mL | ORAL_SOLUTION | Freq: Four times a day (QID) | ORAL | Status: DC | PRN
Start: 2015-04-13 — End: 2015-09-13

## 2015-04-13 NOTE — Patient Instructions (Signed)
Flonase- helps with head symptoms and sneezing Cough syrup 1 teaspoon up to 4 x a day for nausea/coughing- can make you sleepy  DON"T take the Z-pack unless you have fever of 100.5 or greater, coughing up green mucous, or 10 days of symptoms with worsening  Adenovirus Adenoviruses are common viruses that cause many different types of infections. The common cold (upper respiratory infection) is the most common type of infection from an adenovirus. Adenoviruses can also infect your digestive system, eyes, lungs, and bladder. An adenovirus spreads easily from person to person. This is especially true if you are in close contact with someone who is sick. You can breathe in the virus after a sick person coughs or sneezes. Adenoviruses can live outside the body for many weeks. Therefore, you can also get sick after touching an object the virus is living on and then touching your nose or mouth. Adenovirus infections are usually not serious unless you have another health problem that makes it hard for you to fight off the infection.  CAUSES  There are more than 50 types of adenoviruses that can cause infections in humans. Different types of adenoviruses cause different types of infection.  RISK FACTORS The risk for an adenovirus infection is higher for:  People who spend a lot of time in places where there are lots of other people. This includes schools, summer camps, and day care centers.  Babies.  Elderly people.  People with a weak body defense system (immune system).  People with heart or lung disease. SIGNS AND SYMPTOMS  It can take 2-14 days to develop symptoms after the virus gets into your body. Symptoms may include:  Fever.  Sore throat.  Ear pain or fullness.  Nasal congestion.  Cough.  Difficulty breathing.  Stomachache.  Diarrhea.  Pain while passing urine.  Blood in the urine.  Pinkeye (conjunctivitis). DIAGNOSIS  Your health care provider may diagnose an adenovirus  infection from your signs and symptoms. A physical exam will also be done. You may have tests to make sure your symptoms are not caused by another type of problem. These can include a blood test, throat culture, or chest X-ray. TREATMENT  There is no treatment for an adenovirus infection. These infections usually clear up on their own with home care. Your health care provider may recommend over-the-counter medicine to help relieve a sore throat, fever, or headache. HOME CARE INSTRUCTIONS  Rest at home until your symptoms go away.  Drink enough fluid to keep your urine clear or pale yellow.  Take medicines only as directed by your health care provider. PREVENTION   Wash your hands often with soap and water.  Avoid close contact with people who are sick.  Do not go to school or work when you are sick.  Cover your nose or mouth when you sneeze or cough. SEEK MEDICAL CARE IF: Your symptoms of adenovirus infection do not clear up or are getting worse after several days. SEEK IMMEDIATE MEDICAL CARE IF: You have trouble breathing. MAKE SURE YOU:  Understand these instructions.  Will watch your condition.  Will get help right away if you are not doing well or get worse.   This information is not intended to replace advice given to you by your health care provider. Make sure you discuss any questions you have with your health care provider.   Document Released: 05/10/2002 Document Revised: 03/10/2014 Document Reviewed: 06/22/2013 Else[employer] Interactive Patient Education [employer]

## 2015-04-13 NOTE — Progress Notes (Signed)
Pre visit review using our clinic review tool, if applicable. No additional management support is needed unless otherwise documented below in the visit note. 

## 2015-04-13 NOTE — Progress Notes (Signed)
Patient ID: Casey Newton, female    DOB: May 22, 1955  Age: 60 y.o. MRN: [id number]  CC: No chief complaint on file.   HPI Casey Newton presents for CC of uri symptoms x 3 days.   1) SOB, cough, fever, congestion, HA, nausea  Sinus pressure maxillary   Tmax 100 Sick contacts: Denies   Treatment to date:  Tylenol  Juice- orange juice  Cough drops   History Casey Newton has a past medical history of Hypertension; OSA on CPAP; and Arthritis.   She has past surgical history that includes Cesarean section; Abdominal hysterectomy; Bunionectomy; Tonsillectomy; Dental Implants; and Cholecystectomy (N/A, 11/12/2014).   Her family history includes Leukemia in her father; Liver disease in her mother.She reports that she has never smoked. She has never used smokeless tobacco. She reports that she does not drink alcohol or use illicit drugs.  Outpatient Prescriptions Prior to Visit  Medication Sig Dispense Refill  . aspirin 81 MG tablet Take 81 mg by mouth daily.    . calcium carbonate 200 MG capsule Take 800 mg by mouth daily.    Marland Kitchen losartan-hydrochlorothiazide (HYZAAR) 100-12.5 MG per tablet Take 1 tablet by mouth daily. 90 tablet 3  . Multiple Vitamin (MULTIVITAMIN) tablet Take 1 tablet by mouth daily.    . Omega-3 Fatty Acids (FISH OIL) 1000 MG CAPS Take 1,000 mg by mouth daily.     Marland Kitchen PAPAYA PO Take 1 tablet by mouth as needed (for heartburn).     Marland Kitchen HYDROcodone-acetaminophen (NORCO/VICODIN) 5-325 MG per tablet Take 1-2 tablets by mouth every 4 (four) hours as needed for moderate pain. 40 tablet 0   No facility-administered medications prior to visit.    ROS Review of Systems  Constitutional: Positive for fever and fatigue. Negative for chills and diaphoresis.  HENT: Positive for congestion, postnasal drip, rhinorrhea, sinus pressure, sneezing and sore throat. Negative for ear pain, trouble swallowing and voice change.   Eyes: Negative for visual disturbance.  Respiratory:  Positive for cough and shortness of breath. Negative for chest tightness and wheezing.   Cardiovascular: Negative for chest pain, palpitations and leg swelling.  Gastrointestinal: Negative for nausea, vomiting and diarrhea.  Skin: Negative for rash.  Neurological: Positive for light-headedness and headaches. Negative for dizziness.    Objective:  BP 106/70 mmHg  Pulse 83  Temp(Src) 99.1 F (37.3 C) (Oral)  Resp 18  Wt 211 lb (95.709 kg)  SpO2 97%  Physical Exam  Constitutional: She is oriented to person, place, and time. She appears well-developed and well-nourished. No distress.  HENT:  Head: Normocephalic and atraumatic.  Right Ear: External ear normal.  Left Ear: External ear normal.  Mouth/Throat: Oropharynx is clear and moist. No oropharyngeal exudate.  TMs clear bilaterally  Eyes: EOM are normal. Pupils are equal, round, and reactive to light. Right eye exhibits no discharge. Left eye exhibits no discharge. No scleral icterus.  Neck: Normal range of motion. Neck supple.  Cardiovascular: Normal rate, regular rhythm and normal heart sounds.  Exam reveals no gallop and no friction rub.   No murmur heard. Pulmonary/Chest: Effort normal and breath sounds normal. No respiratory distress. She has no wheezes. She has no rales. She exhibits no tenderness.  Lymphadenopathy:    She has no cervical adenopathy.  Neurological: She is alert and oriented to person, place, and time. No cranial nerve deficit. She exhibits normal muscle tone. Coordination normal.  Skin: Skin is warm and dry. No rash noted. She is not diaphoretic.  Psychiatric: She  has a normal mood and affect. Her behavior is normal. Judgment and thought content normal.   Assessment & Plan:   Diagnoses and all orders for this visit:  Acute URI  Other orders -     promethazine-dextromethorphan (PROMETHAZINE-DM) 6.25-15 MG/5ML syrup; Take 5 mLs by mouth 4 (four) times daily as needed for cough. -     benzonatate  (TESSALON) 100 MG capsule; Take 1 capsule (100 mg total) by mouth 2 (two) times daily as needed for cough. -     azithromycin (ZITHROMAX) 250 MG tablet; Take 2 tablets by mouth on day 1, take 1 tablet by mouth each day after for 4 days. -     fluticasone (FLONASE) 50 MCG/ACT nasal spray; Place 2 sprays into both nostrils daily.   I have discontinued Ms. Matlack's HYDROcodone-acetaminophen. I am also having her start on promethazine-dextromethorphan, benzonatate, azithromycin, and fluticasone. Additionally, I am having her maintain her multivitamin, calcium carbonate, Fish Oil, aspirin, PAPAYA PO, and losartan-hydrochlorothiazide.  Meds ordered this encounter  Medications  . promethazine-dextromethorphan (PROMETHAZINE-DM) 6.25-15 MG/5ML syrup    Sig: Take 5 mLs by mouth 4 (four) times daily as needed for cough.    Dispense:  180 mL    Refill:  0    Order Specific Question:  Supervising Provider    Answer:  Duncan Dull L [[id number]]  . benzonatate (TESSALON) 100 MG capsule    Sig: Take 1 capsule (100 mg total) by mouth 2 (two) times daily as needed for cough.    Dispense:  20 capsule    Refill:  0    Order Specific Question:  Supervising Provider    Answer:  Duncan Dull L [[id number]]  . azithromycin (ZITHROMAX) 250 MG tablet    Sig: Take 2 tablets by mouth on day 1, take 1 tablet by mouth each day after for 4 days.    Dispense:  6 each    Refill:  0    Order Specific Question:  Supervising Provider    Answer:  Duncan Dull L [[id number]]  . fluticasone (FLONASE) 50 MCG/ACT nasal spray    Sig: Place 2 sprays into both nostrils daily.    Dispense:  16 g    Refill:  6    Order Specific Question:  Supervising Provider    Answer:  Sherlene Shams [[id number]]     Follow-up: Return if symptoms worsen or fail to improve.

## 2015-04-17 ENCOUNTER — Encounter: Payer: Self-pay | Admitting: Nurse Practitioner

## 2015-04-17 DIAGNOSIS — J069 Acute upper respiratory infection, unspecified: Secondary | ICD-10-CM | POA: Insufficient documentation

## 2015-04-17 NOTE — Assessment & Plan Note (Signed)
New Onset Due to length of symptoms with worsening will treat empirically  Z-pack  was sent to the pharmacy Flonase, tessalon, and Promethazine-DM sent as well for symptom management Encouraged Probiotics Continue OTC measures  FU prn worsening/failure to improve.

## 2015-05-31 ENCOUNTER — Other Ambulatory Visit: Payer: Self-pay | Admitting: Nurse Practitioner

## 2015-09-13 ENCOUNTER — Encounter: Payer: Self-pay | Admitting: Internal Medicine

## 2015-09-13 ENCOUNTER — Other Ambulatory Visit (INDEPENDENT_AMBULATORY_CARE_PROVIDER_SITE_OTHER): Payer: BLUE CROSS/BLUE SHIELD

## 2015-09-13 ENCOUNTER — Ambulatory Visit (INDEPENDENT_AMBULATORY_CARE_PROVIDER_SITE_OTHER): Payer: BLUE CROSS/BLUE SHIELD | Admitting: Internal Medicine

## 2015-09-13 VITALS — BP 126/82 | HR 59 | Temp 98.5°F | Resp 12 | Ht 61.0 in | Wt 206.4 lb

## 2015-09-13 DIAGNOSIS — Z0000 Encounter for general adult medical examination without abnormal findings: Secondary | ICD-10-CM

## 2015-09-13 DIAGNOSIS — Z1159 Encounter for screening for other viral diseases: Secondary | ICD-10-CM

## 2015-09-13 DIAGNOSIS — E669 Obesity, unspecified: Secondary | ICD-10-CM

## 2015-09-13 DIAGNOSIS — I10 Essential (primary) hypertension: Secondary | ICD-10-CM

## 2015-09-13 LAB — LIPID PANEL
CHOLESTEROL: 151 mg/dL (ref 0–200)
HDL: 55.1 mg/dL (ref >39.00)
LDL Cholesterol: 77 mg/dL (ref 0–99)
NonHDL: 96.07
TRIGLYCERIDES: 94 mg/dL (ref 0.0–149.0)
Total CHOL/HDL Ratio: 3
VLDL: 18.8 mg/dL (ref 0.0–40.0)

## 2015-09-13 LAB — COMPREHENSIVE METABOLIC PANEL
ALBUMIN: 4.3 g/dL (ref 3.5–5.2)
ALK PHOS: 111 U/L (ref 39–117)
ALT: 23 U/L (ref 0–35)
AST: 18 U/L (ref 0–37)
BUN: 13 mg/dL (ref 6–23)
CHLORIDE: 103 meq/L (ref 96–112)
CO2: 34 mEq/L — ABNORMAL HIGH (ref 19–32)
Calcium: 9.9 mg/dL (ref 8.4–10.5)
Creatinine, Ser: 0.88 mg/dL (ref 0.40–1.20)
GFR: 84.07 mL/min (ref >60.00)
Glucose, Bld: 104 mg/dL — ABNORMAL HIGH (ref 70–99)
POTASSIUM: 4.3 meq/L (ref 3.5–5.1)
SODIUM: 142 meq/L (ref 135–145)
TOTAL PROTEIN: 7.4 g/dL (ref 6.0–8.3)
Total Bilirubin: 0.5 mg/dL (ref 0.2–1.2)

## 2015-09-13 LAB — URINALYSIS, ROUTINE W REFLEX MICROSCOPIC
BILIRUBIN URINE: NEGATIVE
HGB URINE DIPSTICK: NEGATIVE
KETONES UR: NEGATIVE
LEUKOCYTES UA: NEGATIVE
NITRITE: NEGATIVE
Specific Gravity, Urine: 1.015 (ref 1.000–1.030)
TOTAL PROTEIN, URINE-UPE24: NEGATIVE
URINE GLUCOSE: NEGATIVE
Urobilinogen, UA: 0.2 (ref 0.0–1.0)
pH: 8 (ref 5.0–8.0)

## 2015-09-13 LAB — CBC
HCT: 39.8 % (ref 36.0–46.0)
HEMOGLOBIN: 12.8 g/dL (ref 12.0–15.0)
MCHC: 32.1 g/dL (ref 30.0–36.0)
MCV: 85.4 fl (ref 78.0–100.0)
Platelets: 265 10*3/uL (ref 150.0–400.0)
RBC: 4.66 Mil/uL (ref 3.87–5.11)
RDW: 14.6 % (ref 11.5–15.5)
WBC: 4.5 10*3/uL (ref 4.0–10.5)

## 2015-09-13 LAB — HEMOGLOBIN A1C: HEMOGLOBIN A1C: 5.7 % (ref 4.6–6.5)

## 2015-09-13 NOTE — Progress Notes (Signed)
Pre visit review using our clinic review tool, if applicable. No additional management support is needed unless otherwise documented below in the visit note. 

## 2015-09-13 NOTE — Progress Notes (Signed)
   Subjective:    Patient ID: Casey Newton, female    DOB: [birth year], 60 y.o.   MRN: [id number]  HPI The patient is a 60 YO female coming in for wellness.   [facility] reviewed and updated.   Review of Systems  Constitutional: Positive for activity change. Negative for fever, chills, appetite change, fatigue and unexpected weight change.       Diet change to cut back on carbs  HENT: Negative.   Eyes: Negative.   Respiratory: Negative for cough, chest tightness, shortness of breath and wheezing.   Cardiovascular: Negative for chest pain, palpitations and leg swelling.  Gastrointestinal: Negative for nausea, abdominal pain, diarrhea, constipation and abdominal distention.  Musculoskeletal: Positive for arthralgias. Negative for myalgias, back pain and gait problem.  Skin: Negative.   Neurological: Negative.   Psychiatric/Behavioral: Negative.       Objective:   Physical Exam  Constitutional: She is oriented to person, place, and time. She appears well-developed and well-nourished.  Overweight  HENT:  Head: Normocephalic and atraumatic.  Right Ear: External ear normal.  Left Ear: External ear normal.  Eyes: EOM are normal.  Neck: Normal range of motion.  Cardiovascular: Normal rate and regular rhythm.   No murmur heard. Carotids without bruits bilaterally  Pulmonary/Chest: Effort normal and breath sounds normal. No respiratory distress. She has no wheezes.  Abdominal: Soft. Bowel sounds are normal. She exhibits no distension. There is no tenderness. There is no rebound.  Musculoskeletal: She exhibits no edema.  Neurological: She is alert and oriented to person, place, and time.  Skin: Skin is warm and dry.  Psychiatric: She has a normal mood and affect.   Filed Vitals:   09/13/15 1008  BP: 126/82  Pulse: 59  Temp: 98.5 F (36.9 C)  TempSrc: Oral  Resp: 12  Height: 5\' 1"  (1.549 m)  Weight: 206 lb 6.4 oz (93.622 kg)  SpO2: 98%      Assessment &  Plan:

## 2015-09-13 NOTE — Assessment & Plan Note (Signed)
Checking labs today. She declines colonoscopy. Hepatitis C screening done today. Declines HIV screening and declines tdap. Counseled on the dangers of distracted driving. Given screening recommendations.

## 2015-09-13 NOTE — Assessment & Plan Note (Signed)
BP at goal on her losartan/hctz. Checking CMP and adjust as needed.  

## 2015-09-13 NOTE — Patient Instructions (Signed)
We are checking the blood work and the urine today and will call you with the results.   Keep up the good work with the diet! You are doing great!  Health Maintenance, Female Adopting a healthy lifestyle and getting preventive care can go a long way to promote health and wellness. Talk with your health care provider about what schedule of regular examinations is right for you. This is a good chance for you to check in with your provider about disease prevention and staying healthy. In between checkups, there are plenty of things you can do on your own. Experts have done a lot of research about which lifestyle changes and preventive measures are most likely to keep you healthy. Ask your health care provider for more information. WEIGHT AND DIET  Eat a healthy diet  Be sure to include plenty of vegetables, fruits, low-fat dairy products, and lean protein.  Do not eat a lot of foods high in solid fats, added sugars, or salt.  Get regular exercise. This is one of the most important things you can do for your health.  Most adults should exercise for at least 150 minutes each week. The exercise should increase your heart rate and make you sweat (moderate-intensity exercise).  Most adults should also do strengthening exercises at least twice a week. This is in addition to the moderate-intensity exercise.  Maintain a healthy weight  Body mass index (BMI) is a measurement that can be used to identify possible weight problems. It estimates body fat based on height and weight. Your health care provider can help determine your BMI and help you achieve or maintain a healthy weight.  For females 28 years of age and older:   A BMI below 18.5 is considered underweight.  A BMI of 18.5 to 24.9 is normal.  A BMI of 25 to 29.9 is considered overweight.  A BMI of 30 and above is considered obese.  Watch levels of cholesterol and blood lipids  You should start having your blood tested for lipids and  cholesterol at 60 years of age, then have this test every 5 years.  You may need to have your cholesterol levels checked more often if:  Your lipid or cholesterol levels are high.  You are older than 61 years of age.  You are at high risk for heart disease.  CANCER SCREENING   Lung Cancer  Lung cancer screening is recommended for adults 72-4 years old who are at high risk for lung cancer because of a history of smoking.  A yearly low-dose CT scan of the lungs is recommended for people who:  Currently smoke.  Have quit within the past 15 years.  Have at least a 30-pack-year history of smoking. A pack year is smoking an average of one pack of cigarettes a day for 1 year.  Yearly screening should continue until it has been 15 years since you quit.  Yearly screening should stop if you develop a health problem that would prevent you from having lung cancer treatment.  Breast Cancer  Practice breast self-awareness. This means understanding how your breasts normally appear and feel.  It also means doing regular breast self-exams. Let your health care provider know about any changes, no matter how small.  If you are in your 20s or 30s, you should have a clinical breast exam (CBE) by a health care provider every 1-3 years as part of a regular health exam.  If you are 37 or older, have a CBE every  having a breast X-ray (mammogram) every year.  If you have a family history of breast cancer, talk to your health care provider about genetic screening.  If you are at high risk for breast cancer, talk to your health care provider about having an MRI and a mammogram every year.  Breast cancer gene (BRCA) assessment is recommended for women who have family members with BRCA-related cancers. BRCA-related cancers include:  Breast.  Ovarian.  Tubal.  Peritoneal cancers.  Results of the assessment will determine the need for genetic counseling and BRCA1 and BRCA2  testing. Cervical Cancer Your health care provider may recommend that you be screened regularly for cancer of the pelvic organs (ovaries, uterus, and vagina). This screening involves a pelvic examination, including checking for microscopic changes to the surface of your cervix (Pap test). You may be encouraged to have this screening done every 3 years, beginning at age 21.  For women ages 30-65, health care providers may recommend pelvic exams and Pap testing every 3 years, or they may recommend the Pap and pelvic exam, combined with testing for human papilloma virus (HPV), every 5 years. Some types of HPV increase your risk of cervical cancer. Testing for HPV may also be done on women of any age with unclear Pap test results.  Other health care providers may not recommend any screening for nonpregnant women who are considered low risk for pelvic cancer and who do not have symptoms. Ask your health care provider if a screening pelvic exam is right for you.  If you have had past treatment for cervical cancer or a condition that could lead to cancer, you need Pap tests and screening for cancer for at least 20 years after your treatment. If Pap tests have been discontinued, your risk factors (such as having a new sexual partner) need to be reassessed to determine if screening should resume. Some women have medical problems that increase the chance of getting cervical cancer. In these cases, your health care provider may recommend more frequent screening and Pap tests. Colorectal Cancer  This type of cancer can be detected and often prevented.  Routine colorectal cancer screening usually begins at 61 years of age and continues through 61 years of age.  Your health care provider may recommend screening at an earlier age if you have risk factors for colon cancer.  Your health care provider may also recommend using home test kits to check for hidden blood in the stool.  A small camera at the end of a  tube can be used to examine your colon directly (sigmoidoscopy or colonoscopy). This is done to check for the earliest forms of colorectal cancer.  Routine screening usually begins at age 50.  Direct examination of the colon should be repeated every 5-10 years through 61 years of age. However, you may need to be screened more often if early forms of precancerous polyps or small growths are found. Skin Cancer  Check your skin from head to toe regularly.  Tell your health care provider about any new moles or changes in moles, especially if there is a change in a mole's shape or color.  Also tell your health care provider if you have a mole that is larger than the size of a pencil eraser.  Always use sunscreen. Apply sunscreen liberally and repeatedly throughout the day.  Protect yourself by wearing long sleeves, pants, a wide-brimmed hat, and sunglasses whenever you are outside. HEART DISEASE, DIABETES, AND HIGH BLOOD PRESSURE   High blood   High blood pressure causes heart disease and increases the risk of stroke. High blood pressure is more likely to develop in:  People who have blood pressure in the high end of the normal range (130-139/85-89 mm Hg).  People who are overweight or obese.  People who are African American.  If you are 18-39 years of age, have your blood pressure checked every 3-5 years. If you are 40 years of age or older, have your blood pressure checked every year. You should have your blood pressure measured twice--once when you are at a hospital or clinic, and once when you are not at a hospital or clinic. Record the average of the two measurements. To check your blood pressure when you are not at a hospital or clinic, you can use:  An automated blood pressure machine at a pharmacy.  A home blood pressure monitor.  If you are between 55 years and 79 years old, ask your health care provider if you should take aspirin to prevent strokes.  Have regular diabetes screenings. This  involves taking a blood sample to check your fasting blood sugar level.  If you are at a normal weight and have a low risk for diabetes, have this test once every three years after 61 years of age.  If you are overweight and have a high risk for diabetes, consider being tested at a younger age or more often. PREVENTING INFECTION  Hepatitis B  If you have a higher risk for hepatitis B, you should be screened for this virus. You are considered at high risk for hepatitis B if:  You were born in a country where hepatitis B is common. Ask your health care provider which countries are considered high risk.  Your parents were born in a high-risk country, and you have not been immunized against hepatitis B (hepatitis B vaccine).  You have HIV or AIDS.  You use needles to inject street drugs.  You live with someone who has hepatitis B.  You have had sex with someone who has hepatitis B.  You get hemodialysis treatment.  You take certain medicines for conditions, including cancer, organ transplantation, and autoimmune conditions. Hepatitis C  Blood testing is recommended for:  Everyone born from 1945 through 1965.  Anyone with known risk factors for hepatitis C. Sexually transmitted infections (STIs)  You should be screened for sexually transmitted infections (STIs) including gonorrhea and chlamydia if:  You are sexually active and are younger than 61 years of age.  You are older than 61 years of age and your health care provider tells you that you are at risk for this type of infection.  Your sexual activity has changed since you were last screened and you are at an increased risk for chlamydia or gonorrhea. Ask your health care provider if you are at risk.  If you do not have HIV, but are at risk, it may be recommended that you take a prescription medicine daily to prevent HIV infection. This is called pre-exposure prophylaxis (PrEP). You are considered at risk if:  You are  sexually active and do not regularly use condoms or know the HIV status of your partner(s).  You take drugs by injection.  You are sexually active with a partner who has HIV. Talk with your health care provider about whether you are at high risk of being infected with HIV. If you choose to begin PrEP, you should first be tested for HIV. You should then be tested every 3 months for as   long as you are taking PrEP.  PREGNANCY   If you are premenopausal and you may become pregnant, ask your health care provider about preconception counseling.  If you may become pregnant, take 400 to 800 micrograms (mcg) of folic acid every day.  If you want to prevent pregnancy, talk to your health care provider about birth control (contraception). OSTEOPOROSIS AND MENOPAUSE   Osteoporosis is a disease in which the bones lose minerals and strength with aging. This can result in serious bone fractures. Your risk for osteoporosis can be identified using a bone density scan.  If you are 27 years of age or older, or if you are at risk for osteoporosis and fractures, ask your health care provider if you should be screened.  Ask your health care provider whether you should take a calcium or vitamin D supplement to lower your risk for osteoporosis.  Menopause may have certain physical symptoms and risks.  Hormone replacement therapy may reduce some of these symptoms and risks. Talk to your health care provider about whether hormone replacement therapy is right for you.  HOME CARE INSTRUCTIONS   Schedule regular health, dental, and eye exams.  Stay current with your immunizations.   Do not use any tobacco products including cigarettes, chewing tobacco, or electronic cigarettes.  If you are pregnant, do not drink alcohol.  If you are breastfeeding, limit how much and how often you drink alcohol.  Limit alcohol intake to no more than 1 drink per day for nonpregnant women. One drink equals 12 ounces of beer, 5  ounces of wine, or 1 ounces of hard liquor.  Do not use street drugs.  Do not share needles.  Ask your health care provider for help if you need support or information about quitting drugs.  Tell your health care provider if you often feel depressed.  Tell your health care provider if you have ever been abused or do not feel safe at home.   This information is not intended to replace advice given to you by your health care provider. Make sure you discuss any questions you have with your health care provider.   Document Released: 09/02/2010 Document Revised: 03/10/2014 Document Reviewed: 01/19/2013 Elsevier Interactive Patient Education [employer].

## 2015-09-13 NOTE — Assessment & Plan Note (Signed)
Weight is down about 8 pounds from last year and she has changed her diet.

## 2015-09-14 LAB — HEPATITIS C ANTIBODY: HCV AB: NEGATIVE

## 2015-09-27 ENCOUNTER — Other Ambulatory Visit: Payer: Self-pay | Admitting: Internal Medicine

## 2015-09-27 ENCOUNTER — Other Ambulatory Visit: Payer: Self-pay | Admitting: Obstetrics and Gynecology

## 2015-09-27 DIAGNOSIS — Z1231 Encounter for screening mammogram for malignant neoplasm of breast: Secondary | ICD-10-CM

## 2015-10-17 ENCOUNTER — Ambulatory Visit
Admission: RE | Admit: 2015-10-17 | Discharge: 2015-10-17 | Disposition: A | Discharge status: Home / Self Care | Payer: BLUE CROSS/BLUE SHIELD | Source: Ambulatory Visit | Attending: Obstetrics and Gynecology | Admitting: Obstetrics and Gynecology

## 2015-10-17 DIAGNOSIS — Z1231 Encounter for screening mammogram for malignant neoplasm of breast: Secondary | ICD-10-CM | POA: Diagnosis not present

## 2015-10-17 DIAGNOSIS — R319 Hematuria, unspecified: Secondary | ICD-10-CM | POA: Diagnosis not present

## 2015-10-17 DIAGNOSIS — Z130 Encounter for screening for diseases of the blood and blood-forming organs and certain disorders involving the immune mechanism: Secondary | ICD-10-CM | POA: Diagnosis not present

## 2015-10-17 DIAGNOSIS — Z01419 Encounter for gynecological examination (general) (routine) without abnormal findings: Secondary | ICD-10-CM | POA: Diagnosis not present

## 2015-10-17 DIAGNOSIS — Z1389 Encounter for screening for other disorder: Secondary | ICD-10-CM | POA: Diagnosis not present

## 2015-12-13 ENCOUNTER — Ambulatory Visit (INDEPENDENT_AMBULATORY_CARE_PROVIDER_SITE_OTHER): Payer: BLUE CROSS/BLUE SHIELD | Admitting: Adult Health

## 2015-12-13 ENCOUNTER — Encounter: Payer: Self-pay | Admitting: Adult Health

## 2015-12-13 VITALS — BP 140/62 | Temp 98.1°F | Ht 61.0 in | Wt 199.6 lb

## 2015-12-13 DIAGNOSIS — L03011 Cellulitis of right finger: Secondary | ICD-10-CM | POA: Diagnosis not present

## 2015-12-13 MED ORDER — CEPHALEXIN 500 MG PO CAPS: 500.0000 mg | ORAL_CAPSULE | Freq: Three times a day (TID) | ORAL | 0 refills | Status: DC

## 2015-12-13 NOTE — Progress Notes (Signed)
   Subjective:    Patient ID: Casey Newton, female    DOB: May 07, 1955, 60 y.o.   MRN: [id number]  HPI  60 year old female who presents to the office today for the acute complaint of right finger pain and swelling. She reports that about two days ago she noticed pain and swelling to her right middle finger. The tip of her right middle finger had become hard and painful and she had discomfort at the top of the nail bed. Today she states that the hardness in the distal end of the finger has resolved but she continues to have pain in her finger.   She has not noticed any drainage  Denies having a fever  Review of Systems  Constitutional: Negative.   Musculoskeletal: Positive for joint swelling.  Skin: Positive for color change and wound.  All other systems reviewed and are negative.      Objective:   Physical Exam  Constitutional: She is oriented to person, place, and time. She appears well-developed and well-nourished. No distress.  Neurological: She is alert and oriented to person, place, and time.  Skin: Skin is warm and dry. She is not diaphoretic. There is erythema.  Slight redness and pain to distal tip of right ring finger with palpation. There is edema noted to DIP joint. She appears to have an infection under her finger nail   Psychiatric: She has a normal mood and affect. Her behavior is normal. Judgment and thought content normal.  Nursing note and vitals reviewed.     Assessment & Plan:  1. Paronychia of right middle finger - Unable to I& D in the office.  - Will have her soak her finger twice a day for 15 minutes and will trial antibiotic therapy.  - cephALEXin (KEFLEX) 500 MG capsule; Take 1 capsule (500 mg total) by mouth 3 (three) times daily.  Dispense: 30 capsule; Refill: 0 - Follow up with PCP if no improvement  Shirline Freesory Lore Polka, NP

## 2015-12-14 DIAGNOSIS — Z01419 Encounter for gynecological examination (general) (routine) without abnormal findings: Secondary | ICD-10-CM | POA: Diagnosis not present

## 2015-12-21 ENCOUNTER — Ambulatory Visit (HOSPITAL_COMMUNITY)
Admission: EM | Admit: 2015-12-21 | Discharge: 2015-12-21 | Disposition: A | Discharge status: Nursing Home (Includes ICF) | Payer: BLUE CROSS/BLUE SHIELD | Attending: Family Medicine | Admitting: Family Medicine

## 2015-12-21 ENCOUNTER — Encounter (HOSPITAL_COMMUNITY): Payer: Self-pay | Admitting: Emergency Medicine

## 2015-12-21 ENCOUNTER — Emergency Department (HOSPITAL_COMMUNITY)
Admission: EM | Admit: 2015-12-21 | Discharge: 2015-12-21 | Disposition: A | Discharge status: Home / Self Care | Payer: BLUE CROSS/BLUE SHIELD | Attending: Emergency Medicine | Admitting: Emergency Medicine

## 2015-12-21 ENCOUNTER — Encounter (HOSPITAL_COMMUNITY): Payer: Self-pay

## 2015-12-21 DIAGNOSIS — L03011 Cellulitis of right finger: Secondary | ICD-10-CM | POA: Diagnosis not present

## 2015-12-21 DIAGNOSIS — L03012 Cellulitis of left finger: Secondary | ICD-10-CM | POA: Insufficient documentation

## 2015-12-21 DIAGNOSIS — IMO0002 Reserved for concepts with insufficient information to code with codable children: Secondary | ICD-10-CM

## 2015-12-21 DIAGNOSIS — Z7982 Long term (current) use of aspirin: Secondary | ICD-10-CM | POA: Insufficient documentation

## 2015-12-21 DIAGNOSIS — I10 Essential (primary) hypertension: Secondary | ICD-10-CM | POA: Diagnosis not present

## 2015-12-21 DIAGNOSIS — L03019 Cellulitis of unspecified finger: Secondary | ICD-10-CM | POA: Diagnosis not present

## 2015-12-21 DIAGNOSIS — M7989 Other specified soft tissue disorders: Secondary | ICD-10-CM | POA: Diagnosis present

## 2015-12-21 MED ORDER — BUPIVACAINE HCL 0.25 % IJ SOLN
10.0000 mL | Freq: Once | INTRAMUSCULAR | Status: DC
  Filled 2015-12-21: qty 10

## 2015-12-21 MED ORDER — BUPIVACAINE HCL (PF) 0.25 % IJ SOLN
10.0000 mL | Freq: Once | INTRAMUSCULAR | Status: AC
  Administered 2015-12-21: 10 mL
  Filled 2015-12-21: qty 30

## 2015-12-21 MED ORDER — DOXYCYCLINE HYCLATE 100 MG PO TABS: 100.0000 mg | ORAL_TABLET | Freq: Two times a day (BID) | ORAL | 0 refills | Status: DC

## 2015-12-21 NOTE — Discharge Instructions (Signed)
Take ibuprofen or Tylenol for pain. Keep your finger elevated to reduce swelling and pain. Dressing changes twice a day, wash with soap and water, apply triple antibiotic ointment to the nail bed, followed by a nonadhesive dressing. Keep the wound clean and dry. Follow with Dr. Janee Mornhompson as needed.

## 2015-12-21 NOTE — ED Provider Notes (Signed)
[facility]    CSN: [id number] Arrival date & time: 12/21/15  1522     History   Chief Complaint Chief Complaint  Patient presents with  . Hand Pain    HPI Casey Newton is a 60 y.o. female.   This is a 60 year old woman with right middle finger infection for the last 2 weeks.  She initially saw her primary care doctor who started her on cephalexin. She's had progressive swelling of the fingertip ever since with increasing pain. She tried to get an appointment today with her primary care doctor but the office closed at noon and she was not able to be worked in.      Past Medical History:  Diagnosis Date  . Arthritis    left hip  . Hypertension   . OSA on CPAP     Patient Active Problem List   Diagnosis Date Noted  . Routine general medical examination at a health care facility 09/13/2015  . Obesity 09/13/2014  . Essential hypertension 09/13/2014  . OSA (obstructive sleep apnea) 09/13/2014    Past Surgical History:  Procedure Laterality Date  . ABDOMINAL HYSTERECTOMY     left ovaries  . BUNIONECTOMY     left foot  . CESAREAN SECTION     2 times  . CHOLECYSTECTOMY N/A 11/12/2014   Procedure: LAPAROSCOPIC CHOLECYSTECTOMY;  Surgeon: Jimmye NormanJames Wyatt, MD;  [facility];  Service: General;  Laterality: N/A;  . Dental Implants    . TONSILLECTOMY      OB History    No data available       Home Medications    Prior to Admission medications   Medication Sig Start Date End Date Taking? Authorizing Provider  aspirin 81 MG tablet Take 81 mg by mouth daily.   Yes Historical Provider, MD  calcium carbonate 200 MG capsule Take 800 mg by mouth daily.   Yes Historical Provider, MD  losartan-hydrochlorothiazide (HYZAAR) 100-12.5 MG tablet TAKE 1 TABLET BY MOUTH DAILY. 09/28/15  Yes Myrlene BrokerElizabeth A Crawford, MD  Multiple Vitamin (MULTIVITAMIN) tablet Take 1 tablet by mouth daily.   Yes Historical Provider, MD  Omega-3 Fatty Acids (FISH OIL) 1000 MG CAPS Take  1,000 mg by mouth daily.    Yes Historical Provider, MD  doxycycline (VIBRA-TABS) 100 MG tablet Take 1 tablet (100 mg total) by mouth 2 (two) times daily. 12/21/15   Elvina SidleKurt Amyiah Gaba, MD  fluticasone (FLONASE) 50 MCG/ACT nasal spray Place 2 sprays into both nostrils daily. 04/13/15   Carollee Leitzarrie M Doss, NP  PAPAYA PO Take 1 tablet by mouth as needed (for heartburn).     Historical Provider, MD    Family History Family History  Problem Relation Age of Onset  . Liver disease Mother   . Leukemia Father     Social History Social History  Substance Use Topics  . Smoking status: Never Smoker  . Smokeless tobacco: Never Used  . Alcohol use No     Allergies   Penicillins   Review of Systems Review of Systems   Physical Exam Triage Vital Signs ED Triage Vitals  Enc Vitals Group     BP 12/21/15 1548 162/86     Pulse Rate 12/21/15 1548 63     Resp 12/21/15 1548 18     Temp 12/21/15 1548 97.5 F (36.4 C)     Temp Source 12/21/15 1548 Oral     SpO2 12/21/15 1548 97 %     Weight --  Height --      Head Circumference --      Peak Flow --      Pain Score 12/21/15 1554 10     Pain Loc --      Pain Edu? --      Excl. in GC? --    No data found.   Updated Vital Signs BP 162/86 (BP Location: Left Arm)   Pulse 63   Temp 97.5 F (36.4 C) (Oral)   Resp 18   SpO2 97%      Physical Exam  Constitutional: She is oriented to person, place, and time. She appears well-developed and well-nourished.  Musculoskeletal: She exhibits tenderness.  Right middle finger shows marked swelling on the volar aspect of the distal phalanx. There is a white subungual lunula at the proximal aspect of the nail.  The nail was opened with Bovie. A few drops of purulent material was expressed but it quickly coagulated and the hole stop draining  I discussed the situation with Dr. Mack Hook who recommends a digital block with removal the nail and an incision into the pulp of her distal phalanx. For  that to happen, patient will need to be transferred down to the emergency department were there are the resources to carry this out.  Neurological: She is alert and oriented to person, place, and time.  Nursing note and vitals reviewed.    UC Treatments / Results  Labs (all labs ordered are listed, but only abnormal results are displayed) Labs Reviewed - No data to display  EKG  EKG Interpretation None       Radiology No results found.  Procedures Procedures (including critical care time)  Medications Ordered in UC Medications - No data to display   Initial Impression / Assessment and Plan / UC Course  I have reviewed the triage vital signs and the nursing notes.  Pertinent labs & imaging results that were available during my care of the patient were reviewed by me and considered in my medical decision making (see chart for details).  Clinical Course    Final Clinical Impressions(s) / UC Diagnoses   Final diagnoses:  Felon    New Prescriptions New Prescriptions   DOXYCYCLINE (VIBRA-TABS) 100 MG TABLET    Take 1 tablet (100 mg total) by mouth 2 (two) times daily.     Elvina Sidle, MD 12/21/15 1622

## 2015-12-21 NOTE — ED Triage Notes (Signed)
Pt states seen 2 weeks ago for nail bed infection, given abx, no improvement. [facility] Pt states they drained some pus at UC, sent here for follow up care.

## 2015-12-21 NOTE — ED Triage Notes (Signed)
Pt reports infection around nail bed of right middle finger onset 2 weeks  Sx include swelling and pain  Denies fevers, chills, drainage  Seen by PCP and was given antibiotics w/no relief  A&O x4... NAD

## 2015-12-21 NOTE — Discharge Instructions (Signed)
We are sending you to the emergency room were the finger can be opened surgically and drained.

## 2015-12-21 NOTE — ED Provider Notes (Signed)
[facility]   CSN: [id number] Arrival date & time: 12/21/15  1643  By signing my name below, I, Placido Sou, attest that this documentation has been prepared under the direction and in the presence of Fabienne Nolasco, PA-C. Electronically Signed: Placido Sou, ED Scribe. 12/21/15. 5:27 PM.   History   Chief Complaint Chief Complaint  Patient presents with  . Nail Problem    HPI HPI Comments: Casey Newton is a 60 y.o. female who presents to the Emergency Department complaining of constant, moderate, right distal middle finger pain and swelling x 2 weeks. She denies any trauma to the region but states she was cutting her cuticles prior to the onset of her symptoms. Pt describes her pain as throbbing. She was initially dx with an infected nailbed and rx keflex which she has been taking for 8 days w/o significant relief. She went to [facility] and they drained a mild amount of purulent drainage and instructed her to come to the ED for removal of the nail and incision into the pulp of her distal phalanx. She was rx doxycycline at today's visit and has not began the medication. No other associated symptoms at this time.   The history is provided by the patient. No language interpreter was used.    Past Medical History:  Diagnosis Date  . Arthritis    left hip  . Hypertension   . OSA on CPAP     Patient Active Problem List   Diagnosis Date Noted  . Routine general medical examination at a health care facility 09/13/2015  . Obesity 09/13/2014  . Essential hypertension 09/13/2014  . OSA (obstructive sleep apnea) 09/13/2014    Past Surgical History:  Procedure Laterality Date  . ABDOMINAL HYSTERECTOMY     left ovaries  . BUNIONECTOMY     left foot  . CESAREAN SECTION     2 times  . CHOLECYSTECTOMY N/A 11/12/2014   Procedure: LAPAROSCOPIC CHOLECYSTECTOMY;  Surgeon: Jimmye Norman, MD;  Location: [facility] OR;  Service: General;  Laterality: N/A;  . Dental  Implants    . TONSILLECTOMY      OB History    No data available     Home Medications    Prior to Admission medications   Medication Sig Start Date End Date Taking? Authorizing Provider  aspirin 81 MG tablet Take 81 mg by mouth daily.    Historical Provider, MD  calcium carbonate 200 MG capsule Take 800 mg by mouth daily.    Historical Provider, MD  doxycycline (VIBRA-TABS) 100 MG tablet Take 1 tablet (100 mg total) by mouth 2 (two) times daily. 12/21/15   Elvina Sidle, MD  fluticasone (FLONASE) 50 MCG/ACT nasal spray Place 2 sprays into both nostrils daily. 04/13/15   Carollee Leitz, NP  losartan-hydrochlorothiazide (HYZAAR) 100-12.5 MG tablet TAKE 1 TABLET BY MOUTH DAILY. 09/28/15   Myrlene Broker, MD  Multiple Vitamin (MULTIVITAMIN) tablet Take 1 tablet by mouth daily.    Historical Provider, MD  Omega-3 Fatty Acids (FISH OIL) 1000 MG CAPS Take 1,000 mg by mouth daily.     Historical Provider, MD  PAPAYA PO Take 1 tablet by mouth as needed (for heartburn).     Historical Provider, MD    Family History Family History  Problem Relation Age of Onset  . Liver disease Mother   . Leukemia Father     Social History Social History  Substance Use Topics  . Smoking status: Never Smoker  . Smokeless  tobacco: Never Used  . Alcohol use No     Allergies   Penicillins   Review of Systems Review of Systems  Constitutional: Negative for chills and fever.  Musculoskeletal: Positive for arthralgias and joint swelling.  Skin: Positive for color change. Negative for wound.  Neurological: Negative for numbness.   Physical Exam Updated Vital Signs BP 141/68   Pulse (!) 51   Temp 98.1 F (36.7 C)   Resp 16   SpO2 99%   Physical Exam  Constitutional: She is oriented to person, place, and time. She appears well-developed and well-nourished.  HENT:  Head: Normocephalic and atraumatic.  Eyes: EOM are normal.  Neck: Normal range of motion.  Cardiovascular: Normal rate.     Pulmonary/Chest: Effort normal. No respiratory distress.  Abdominal: Soft.  Musculoskeletal: Normal range of motion.  Swelling to the right distal middle finger, with purulent drainage under the fingernail. The finger pad of the finger is swollen, but appears to be soft, with no palpable fullness. Range of motion at the DIP joint.  Neurological: She is alert and oriented to person, place, and time.  Skin: Skin is warm and dry.  Psychiatric: She has a normal mood and affect.  Nursing note and vitals reviewed.  ED Treatments / Results  Labs (all labs ordered are listed, but only abnormal results are displayed) Labs Reviewed - No data to display  EKG  EKG Interpretation None       Radiology No results found.  Procedures .Nail Removal Date/Time: 12/21/2015 5:56 PM Performed by: Jaynie Crumble Authorized by: Jaynie Crumble   Consent:    Consent obtained:  Verbal   Consent given by:  Patient   Risks discussed:  Pain Location:    Hand:  R long finger Pre-procedure details:    Skin preparation:  Betadine   Preparation: Patient was prepped and draped in the usual sterile fashion   Anesthesia (see MAR for exact dosages):    Anesthesia method:  Local infiltration   Local anesthetic:  Bupivacaine 0.25% w/o epi Nail Removal:    Nail removed:  Complete Post-procedure details:    Dressing:  Petrolatum-impregnated gauze and antibiotic ointment   Patient tolerance of procedure:  Tolerated well, no immediate complications Comments:     Large purulent drainage under the fingernail      DIAGNOSTIC STUDIES: Oxygen Saturation is 99% on RA, normal by my interpretation.    COORDINATION OF CARE: 5:27 PM Discussed next steps with pt. Pt verbalized understanding and is agreeable with the plan.    Medications Ordered in ED Medications  bupivacaine (PF) (MARCAINE) 0.25 % injection 10 mL (10 mLs Infiltration Given by Other 12/21/15 1751)    Initial Impression /  Assessment and Plan / ED Course  I have reviewed the triage vital signs and the nursing notes.  Pertinent labs & imaging results that were available during my care of the patient were reviewed by me and considered in my medical decision making (see chart for details).  Clinical Course   Patient with subungual infection. I resected the nail, with large purulent drainage. Finger irrigated. Dr. Janee Morn came by and saw patient, does not believe patient has a felon, no further treatment other than wound care at home. Patient will be on doxycycline, which she received at urgent care. Careful wound care at home discussed with the dressing changes. Follow with Dr. Janee Morn as needed or if not improving.   Vitals:   12/21/15 1700  BP: 141/68  Pulse: (!) 51  Resp: 16  Temp: 98.1 F (36.7 C)  SpO2: 99%     Final Clinical Impressions(s) / ED Diagnoses   Final diagnoses:  Subungual infection of finger of right hand    New Prescriptions New Prescriptions   No medications on file     Jaynie Crumbleatyana Jevante Hollibaugh, PA-C 12/21/15 1841    Benjiman CoreNathan Pickering, MD 12/21/15 2316

## 2015-12-21 NOTE — Consult Note (Signed)
ORTHOPAEDIC CONSULTATION HISTORY & PHYSICAL REQUESTING PHYSICIAN: No att. providers found  Chief Complaint: R LF problem  HPI: Casey Newton is a 60 y.o. female who reports the development of a problem of the right long finger over the course of the past couple weeks.  She was placed on oral antibiotics and it has continued to worsen.  The fingertip has been swollen, with pus under the nail.  [facility] and sent to the ER for further evaluation and management.  Past Medical History:  Diagnosis Date  . Arthritis    left hip  . Hypertension   . OSA on CPAP    Past Surgical History:  Procedure Laterality Date  . ABDOMINAL HYSTERECTOMY     left ovaries  . BUNIONECTOMY     left foot  . CESAREAN SECTION     2 times  . CHOLECYSTECTOMY N/A 11/12/2014   Procedure: LAPAROSCOPIC CHOLECYSTECTOMY;  Surgeon: Jimmye NormanJames Wyatt, MD;  [facility];  Service: General;  Laterality: N/A;  . Dental Implants    . TONSILLECTOMY     Social History   Social History  . Marital status: Married    Spouse name: N/A  . Number of children: N/A  . Years of education: N/A   Social History Main Topics  . Smoking status: Never Smoker  . Smokeless tobacco: Never Used  . Alcohol use No  . Drug use: No  . Sexual activity: Not Asked   Other Topics Concern  . None   Social History Narrative  . None   Family History  Problem Relation Age of Onset  . Liver disease Mother   . Leukemia Father    Allergies  Allergen Reactions  . Penicillins Nausea Only and Other (See Comments)    Upset stomach   Prior to Admission medications   Medication Sig Start Date End Date Taking? Authorizing Provider  aspirin 81 MG tablet Take 81 mg by mouth daily.    Historical Provider, MD  calcium carbonate 200 MG capsule Take 800 mg by mouth daily.    Historical Provider, MD  doxycycline (VIBRA-TABS) 100 MG tablet Take 1 tablet (100 mg total) by mouth 2 (two) times daily. 12/21/15    Elvina SidleKurt Lauenstein, MD  fluticasone (FLONASE) 50 MCG/ACT nasal spray Place 2 sprays into both nostrils daily. 04/13/15   Carollee Leitzarrie M Doss, NP  losartan-hydrochlorothiazide (HYZAAR) 100-12.5 MG tablet TAKE 1 TABLET BY MOUTH DAILY. 09/28/15   Myrlene BrokerElizabeth A Crawford, MD  Multiple Vitamin (MULTIVITAMIN) tablet Take 1 tablet by mouth daily.    Historical Provider, MD  Omega-3 Fatty Acids (FISH OIL) 1000 MG CAPS Take 1,000 mg by mouth daily.     Historical Provider, MD  PAPAYA PO Take 1 tablet by mouth as needed (for heartburn).     Historical Provider, MD   No results found.  Positive ROS: All other systems have been reviewed and were otherwise negative with the exception of those mentioned in the HPI and as above.  Physical Exam: Vitals: Refer to EMR. Constitutional:  WD, WN, NAD HEENT:  NCAT, EOMI Neuro/Psych:  Alert & oriented to person, place, and time; appropriate mood & affect Lymphatic: No generalized extremity edema or lymphadenopathy Extremities / MSK:  The extremities are normal with respect to appearance, ranges of motion, joint stability, muscle strength/tone, sensation, & perfusion except as otherwise noted:  Right long finger pulp is swollen but doesn't appear tense.  There is clearly subungual purulence.  The ED provider  performed a digital block and remove the nail plate.  I subsequently probed the nailbed and there appeared to be no breaches in it.  The pulp appeared not to be consistent with a felon.  Digital motion was reasonably good  Assessment: Right long finger paronychia/nail complex infection that appears not to have a deep abscess associated with it  Plan: I discussed these findings with her and indicated that in all likelihood this would resolve uneventfully now that the nail has been removed.  She was placed on oral antibiotics, and will follow-up with me or her PCP if this fails to continue to improve and resolve.  Cliffton Asters Janee Morn, MD      Orthopaedic & Hand  Surgery [facility] [address] [place], [place]  [address] Office: [phone] Mobile: [phone]  12/21/2015, 7:27 PM

## 2015-12-21 NOTE — ED Notes (Signed)
Pt verbalized understanding discharge instructions and denies any further needs or questions at this time. VS stable, ambulatory and steady gait.  [facility] 

## 2015-12-23 ENCOUNTER — Telehealth (HOSPITAL_COMMUNITY): Payer: Self-pay

## 2015-12-23 NOTE — Telephone Encounter (Signed)
Pt calling for clarification of wound care for finger.  Spoke w/PA who saw pt [name] PA.  Per PA wash w/soap and water w/dsg change twice daily, soaks not needed.

## 2015-12-31 DIAGNOSIS — L03011 Cellulitis of right finger: Secondary | ICD-10-CM | POA: Diagnosis not present

## 2016-01-11 DIAGNOSIS — G4733 Obstructive sleep apnea (adult) (pediatric): Secondary | ICD-10-CM | POA: Diagnosis not present

## 2016-09-18 ENCOUNTER — Encounter: Payer: BLUE CROSS/BLUE SHIELD | Admitting: Internal Medicine

## 2016-10-12 ENCOUNTER — Other Ambulatory Visit: Payer: Self-pay | Admitting: Internal Medicine

## 2016-11-20 ENCOUNTER — Encounter: Payer: BLUE CROSS/BLUE SHIELD | Admitting: Internal Medicine

## 2016-12-18 ENCOUNTER — Encounter: Payer: Self-pay | Admitting: Internal Medicine

## 2016-12-18 ENCOUNTER — Ambulatory Visit (INDEPENDENT_AMBULATORY_CARE_PROVIDER_SITE_OTHER): Payer: BLUE CROSS/BLUE SHIELD | Admitting: Internal Medicine

## 2016-12-18 ENCOUNTER — Other Ambulatory Visit (INDEPENDENT_AMBULATORY_CARE_PROVIDER_SITE_OTHER): Payer: BLUE CROSS/BLUE SHIELD

## 2016-12-18 ENCOUNTER — Telehealth: Payer: Self-pay

## 2016-12-18 VITALS — BP 144/90 | HR 53 | Temp 97.8°F | Ht 61.0 in | Wt 208.0 lb

## 2016-12-18 DIAGNOSIS — I10 Essential (primary) hypertension: Secondary | ICD-10-CM | POA: Diagnosis not present

## 2016-12-18 DIAGNOSIS — E669 Obesity, unspecified: Secondary | ICD-10-CM | POA: Diagnosis not present

## 2016-12-18 DIAGNOSIS — G4733 Obstructive sleep apnea (adult) (pediatric): Secondary | ICD-10-CM | POA: Diagnosis not present

## 2016-12-18 DIAGNOSIS — Z0000 Encounter for general adult medical examination without abnormal findings: Secondary | ICD-10-CM

## 2016-12-18 LAB — COMPREHENSIVE METABOLIC PANEL
ALK PHOS: 111 U/L (ref 39–117)
ALT: 26 U/L (ref 0–35)
AST: 22 U/L (ref 0–37)
Albumin: 4.2 g/dL (ref 3.5–5.2)
BUN: 11 mg/dL (ref 6–23)
CHLORIDE: 101 meq/L (ref 96–112)
CO2: 33 meq/L — AB (ref 19–32)
Calcium: 9.5 mg/dL (ref 8.4–10.5)
Creatinine, Ser: 0.86 mg/dL (ref 0.40–1.20)
GFR: 85.97 mL/min (ref >60.00)
GLUCOSE: 91 mg/dL (ref 70–99)
POTASSIUM: 3.9 meq/L (ref 3.5–5.1)
Sodium: 139 mEq/L (ref 135–145)
Total Bilirubin: 0.5 mg/dL (ref 0.2–1.2)
Total Protein: 7.2 g/dL (ref 6.0–8.3)

## 2016-12-18 LAB — CBC
HEMATOCRIT: 39.7 % (ref 36.0–46.0)
HEMOGLOBIN: 12.5 g/dL (ref 12.0–15.0)
MCHC: 31.6 g/dL (ref 30.0–36.0)
MCV: 87.2 fl (ref 78.0–100.0)
PLATELETS: 253 10*3/uL (ref 150.0–400.0)
RBC: 4.55 Mil/uL (ref 3.87–5.11)
RDW: 14.8 % (ref 11.5–15.5)
WBC: 4.2 10*3/uL (ref 4.0–10.5)

## 2016-12-18 LAB — LIPID PANEL
CHOL/HDL RATIO: 2
Cholesterol: 148 mg/dL (ref 0–200)
HDL: 61.1 mg/dL (ref >39.00)
LDL Cholesterol: 74 mg/dL (ref 0–99)
NONHDL: 87.14
Triglycerides: 68 mg/dL (ref 0.0–149.0)
VLDL: 13.6 mg/dL (ref 0.0–40.0)

## 2016-12-18 LAB — HEMOGLOBIN A1C: Hgb A1c MFr Bld: 5.7 % (ref 4.6–6.5)

## 2016-12-18 MED ORDER — LOSARTAN POTASSIUM-HCTZ 100-12.5 MG PO TABS: 1.0000 | ORAL_TABLET | Freq: Every day | ORAL | 3 refills | Status: DC

## 2016-12-18 NOTE — Assessment & Plan Note (Signed)
BP at goal on losartan/hctz 100/12.5. Checking CMP and adjust as needed.

## 2016-12-18 NOTE — Assessment & Plan Note (Signed)
Still using CPAP and due to see pulmonary due to interrupted sleep.

## 2016-12-18 NOTE — Assessment & Plan Note (Signed)
Working on weight loss and will continue with getting back to exercise.

## 2016-12-18 NOTE — Telephone Encounter (Signed)
Order [id number]

## 2016-12-18 NOTE — Patient Instructions (Addendum)
We are checking the labs today.  We will have the cologuard sent to the house. This checks for colon polyps or cancer. If it comes back negative there is about a 5% chance of missing some polyps. If it comes back positive we will need to get the colonoscopy.   Consider trying turmeric twice a day for the joints to see if this helps.   Health Maintenance, Female Adopting a healthy lifestyle and getting preventive care can go a long way to promote health and wellness. Talk with your health care provider about what schedule of regular examinations is right for you. This is a good chance for you to check in with your provider about disease prevention and staying healthy. In between checkups, there are plenty of things you can do on your own. Experts have done a lot of research about which lifestyle changes and preventive measures are most likely to keep you healthy. Ask your health care provider for more information. Weight and diet Eat a healthy diet  Be sure to include plenty of vegetables, fruits, low-fat dairy products, and lean protein.  Do not eat a lot of foods high in solid fats, added sugars, or salt.  Get regular exercise. This is one of the most important things you can do for your health. ? Most adults should exercise for at least 150 minutes each week. The exercise should increase your heart rate and make you sweat (moderate-intensity exercise). ? Most adults should also do strengthening exercises at least twice a week. This is in addition to the moderate-intensity exercise.  Maintain a healthy weight  Body mass index (BMI) is a measurement that can be used to identify possible weight problems. It estimates body fat based on height and weight. Your health care provider can help determine your BMI and help you achieve or maintain a healthy weight.  For females 79 years of age and older: ? A BMI below 18.5 is considered underweight. ? A BMI of 18.5 to 24.9 is normal. ? A BMI of 25  to 29.9 is considered overweight. ? A BMI of 30 and above is considered obese.  Watch levels of cholesterol and blood lipids  You should start having your blood tested for lipids and cholesterol at 62 years of age, then have this test every 5 years.  You may need to have your cholesterol levels checked more often if: ? Your lipid or cholesterol levels are high. ? You are older than 62 years of age. ? You are at high risk for heart disease.  Cancer screening Lung Cancer  Lung cancer screening is recommended for adults 45-86 years old who are at high risk for lung cancer because of a history of smoking.  A yearly low-dose CT scan of the lungs is recommended for people who: ? Currently smoke. ? Have quit within the past 15 years. ? Have at least a 30-pack-year history of smoking. A pack year is smoking an average of one pack of cigarettes a day for 1 year.  Yearly screening should continue until it has been 15 years since you quit.  Yearly screening should stop if you develop a health problem that would prevent you from having lung cancer treatment.  Breast Cancer  Practice breast self-awareness. This means understanding how your breasts normally appear and feel.  It also means doing regular breast self-exams. Let your health care provider know about any changes, no matter how small.  If you are in your 20s or 30s, you should  have a clinical breast exam (CBE) by a health care provider every 1-3 years as part of a regular health exam.  If you are 80 or older, have a CBE every year. Also consider having a breast X-ray (mammogram) every year.  If you have a family history of breast cancer, talk to your health care provider about genetic screening.  If you are at high risk for breast cancer, talk to your health care provider about having an MRI and a mammogram every year.  Breast cancer gene (BRCA) assessment is recommended for women who have family members with BRCA-related cancers.  BRCA-related cancers include: ? Breast. ? Ovarian. ? Tubal. ? Peritoneal cancers.  Results of the assessment will determine the need for genetic counseling and BRCA1 and BRCA2 testing.  Cervical Cancer Your health care provider may recommend that you be screened regularly for cancer of the pelvic organs (ovaries, uterus, and vagina). This screening involves a pelvic examination, including checking for microscopic changes to the surface of your cervix (Pap test). You may be encouraged to have this screening done every 3 years, beginning at age 59.  For women ages 47-65, health care providers may recommend pelvic exams and Pap testing every 3 years, or they may recommend the Pap and pelvic exam, combined with testing for human papilloma virus (HPV), every 5 years. Some types of HPV increase your risk of cervical cancer. Testing for HPV may also be done on women of any age with unclear Pap test results.  Other health care providers may not recommend any screening for nonpregnant women who are considered low risk for pelvic cancer and who do not have symptoms. Ask your health care provider if a screening pelvic exam is right for you.  If you have had past treatment for cervical cancer or a condition that could lead to cancer, you need Pap tests and screening for cancer for at least 20 years after your treatment. If Pap tests have been discontinued, your risk factors (such as having a new sexual partner) need to be reassessed to determine if screening should resume. Some women have medical problems that increase the chance of getting cervical cancer. In these cases, your health care provider may recommend more frequent screening and Pap tests.  Colorectal Cancer  This type of cancer can be detected and often prevented.  Routine colorectal cancer screening usually begins at 62 years of age and continues through 62 years of age.  Your health care provider may recommend screening at an earlier age if  you have risk factors for colon cancer.  Your health care provider may also recommend using home test kits to check for hidden blood in the stool.  A small camera at the end of a tube can be used to examine your colon directly (sigmoidoscopy or colonoscopy). This is done to check for the earliest forms of colorectal cancer.  Routine screening usually begins at age 99.  Direct examination of the colon should be repeated every 5-10 years through 62 years of age. However, you may need to be screened more often if early forms of precancerous polyps or small growths are found.  Skin Cancer  Check your skin from head to toe regularly.  Tell your health care provider about any new moles or changes in moles, especially if there is a change in a mole's shape or color.  Also tell your health care provider if you have a mole that is larger than the size of a pencil eraser.  Always  use sunscreen. Apply sunscreen liberally and repeatedly throughout the day.  Protect yourself by wearing long sleeves, pants, a wide-brimmed hat, and sunglasses whenever you are outside.  Heart disease, diabetes, and high blood pressure  High blood pressure causes heart disease and increases the risk of stroke. High blood pressure is more likely to develop in: ? People who have blood pressure in the high end of the normal range (130-139/85-89 mm Hg). ? People who are overweight or obese. ? People who are African American.  If you are 18-27 years of age, have your blood pressure checked every 3-5 years. If you are 32 years of age or older, have your blood pressure checked every year. You should have your blood pressure measured twice-once when you are at a hospital or clinic, and once when you are not at a hospital or clinic. Record the average of the two measurements. To check your blood pressure when you are not at a hospital or clinic, you can use: ? An automated blood pressure machine at a pharmacy. ? A home blood  pressure monitor.  If you are between 72 years and 79 years old, ask your health care provider if you should take aspirin to prevent strokes.  Have regular diabetes screenings. This involves taking a blood sample to check your fasting blood sugar level. ? If you are at a normal weight and have a low risk for diabetes, have this test once every three years after 62 years of age. ? If you are overweight and have a high risk for diabetes, consider being tested at a younger age or more often. Preventing infection Hepatitis B  If you have a higher risk for hepatitis B, you should be screened for this virus. You are considered at high risk for hepatitis B if: ? You were born in a country where hepatitis B is common. Ask your health care provider which countries are considered high risk. ? Your parents were born in a high-risk country, and you have not been immunized against hepatitis B (hepatitis B vaccine). ? You have HIV or AIDS. ? You use needles to inject street drugs. ? You live with someone who has hepatitis B. ? You have had sex with someone who has hepatitis B. ? You get hemodialysis treatment. ? You take certain medicines for conditions, including cancer, organ transplantation, and autoimmune conditions.  Hepatitis C  Blood testing is recommended for: ? Everyone born from [birth year] through 1966. ? Anyone with known risk factors for hepatitis C.  Sexually transmitted infections (STIs)  You should be screened for sexually transmitted infections (STIs) including gonorrhea and chlamydia if: ? You are sexually active and are younger than 62 years of age. ? You are older than 62 years of age and your health care provider tells you that you are at risk for this type of infection. ? Your sexual activity has changed since you were last screened and you are at an increased risk for chlamydia or gonorrhea. Ask your health care provider if you are at risk.  If you do not have HIV, but are at risk,  it may be recommended that you take a prescription medicine daily to prevent HIV infection. This is called pre-exposure prophylaxis (PrEP). You are considered at risk if: ? You are sexually active and do not regularly use condoms or know the HIV status of your partner(s). ? You take drugs by injection. ? You are sexually active with a partner who has HIV.  Talk with your  health care provider about whether you are at high risk of being infected with HIV. If you choose to begin PrEP, you should first be tested for HIV. You should then be tested every 3 months for as long as you are taking PrEP. Pregnancy  If you are premenopausal and you may become pregnant, ask your health care provider about preconception counseling.  If you may become pregnant, take 400 to 800 micrograms (mcg) of folic acid every day.  If you want to prevent pregnancy, talk to your health care provider about birth control (contraception). Osteoporosis and menopause  Osteoporosis is a disease in which the bones lose minerals and strength with aging. This can result in serious bone fractures. Your risk for osteoporosis can be identified using a bone density scan.  If you are 52 years of age or older, or if you are at risk for osteoporosis and fractures, ask your health care provider if you should be screened.  Ask your health care provider whether you should take a calcium or vitamin D supplement to lower your risk for osteoporosis.  Menopause may have certain physical symptoms and risks.  Hormone replacement therapy may reduce some of these symptoms and risks. Talk to your health care provider about whether hormone replacement therapy is right for you. Follow these instructions at home:  Schedule regular health, dental, and eye exams.  Stay current with your immunizations.  Do not use any tobacco products including cigarettes, chewing tobacco, or electronic cigarettes.  If you are pregnant, do not drink  alcohol.  If you are breastfeeding, limit how much and how often you drink alcohol.  Limit alcohol intake to no more than 1 drink per day for nonpregnant women. One drink equals 12 ounces of beer, 5 ounces of wine, or 1 ounces of hard liquor.  Do not use street drugs.  Do not share needles.  Ask your health care provider for help if you need support or information about quitting drugs.  Tell your health care provider if you often feel depressed.  Tell your health care provider if you have ever been abused or do not feel safe at home. This information is not intended to replace advice given to you by your health care provider. Make sure you discuss any questions you have with your health care provider. Document Released: 09/02/2010 Document Revised: 07/26/2015 Document Reviewed: 11/21/2014 Elsevier Interactive Patient Education  [employer].

## 2016-12-18 NOTE — Assessment & Plan Note (Signed)
Checking labs, ordered cologuard. She declines all immunizations. Counseled about shingrix. Counseled about sun safety and mole surveillance as well as dangers of distracted driving. Given screening recommendations.

## 2016-12-18 NOTE — Progress Notes (Signed)
   Subjective:    Patient ID: Casey Newton, female    DOB: Oct 25, 1955, 61 y.o.   MRN: [id number]  HPI The patient is a 61 YO female coming in for wellness. No new concerns.  [facility] reviewed and updated.   Review of Systems  Constitutional: Negative.   HENT: Negative.   Eyes: Negative.   Respiratory: Negative for cough, chest tightness and shortness of breath.   Cardiovascular: Negative for chest pain, palpitations and leg swelling.  Gastrointestinal: Negative for abdominal distention, abdominal pain, constipation, diarrhea, nausea and vomiting.  Musculoskeletal: Negative.   Skin: Negative.   Neurological: Negative.   Psychiatric/Behavioral: Negative.       Objective:   Physical Exam  Constitutional: She is oriented to person, place, and time. She appears well-developed and well-nourished.  HENT:  Head: Normocephalic and atraumatic.  Eyes: EOM are normal.  Neck: Normal range of motion.  Cardiovascular: Normal rate and regular rhythm.   Pulmonary/Chest: Effort normal and breath sounds normal. No respiratory distress. She has no wheezes. She has no rales.  Abdominal: Soft. Bowel sounds are normal. She exhibits no distension. There is no tenderness. There is no rebound.  Musculoskeletal: She exhibits no edema.  Neurological: She is alert and oriented to person, place, and time. Coordination normal.  Skin: Skin is warm and dry.  Psychiatric: She has a normal mood and affect.   Vitals:   12/18/16 1055  BP: (!) 144/90  Pulse: (!) 53  Temp: 97.8 F (36.6 C)  TempSrc: Oral  SpO2: 100%  Weight: 208 lb (94.3 kg)  Height: 5\' 1"  (1.549 m)      Assessment & Plan:  Cologuard ordered

## 2016-12-29 ENCOUNTER — Other Ambulatory Visit: Payer: Self-pay | Admitting: Internal Medicine

## 2017-03-19 ENCOUNTER — Other Ambulatory Visit: Payer: Self-pay | Admitting: Internal Medicine

## 2017-03-19 ENCOUNTER — Other Ambulatory Visit: Payer: Self-pay | Admitting: Obstetrics and Gynecology

## 2017-03-19 DIAGNOSIS — Z139 Encounter for screening, unspecified: Secondary | ICD-10-CM

## 2017-03-26 NOTE — Telephone Encounter (Signed)
Cancelled for inactivity  

## 2017-04-08 ENCOUNTER — Ambulatory Visit: Payer: BLUE CROSS/BLUE SHIELD

## 2017-04-16 ENCOUNTER — Ambulatory Visit
Admission: RE | Admit: 2017-04-16 | Discharge: 2017-04-16 | Disposition: A | Discharge status: Home / Self Care | Payer: BLUE CROSS/BLUE SHIELD | Source: Ambulatory Visit | Attending: Internal Medicine | Admitting: Internal Medicine

## 2017-04-16 DIAGNOSIS — Z1231 Encounter for screening mammogram for malignant neoplasm of breast: Secondary | ICD-10-CM | POA: Diagnosis not present

## 2017-04-16 DIAGNOSIS — Z139 Encounter for screening, unspecified: Secondary | ICD-10-CM

## 2017-04-24 ENCOUNTER — Ambulatory Visit (HOSPITAL_COMMUNITY)
Admission: EM | Admit: 2017-04-24 | Discharge: 2017-04-24 | Disposition: A | Discharge status: Home / Self Care | Payer: BLUE CROSS/BLUE SHIELD | Attending: Family Medicine | Admitting: Family Medicine

## 2017-04-24 ENCOUNTER — Encounter (HOSPITAL_COMMUNITY): Payer: Self-pay | Admitting: Family Medicine

## 2017-04-24 DIAGNOSIS — R69 Illness, unspecified: Secondary | ICD-10-CM

## 2017-04-24 DIAGNOSIS — J111 Influenza due to unidentified influenza virus with other respiratory manifestations: Secondary | ICD-10-CM

## 2017-04-24 MED ORDER — ACETAMINOPHEN 325 MG PO TABS
650.0000 mg | ORAL_TABLET | Freq: Once | ORAL | Status: AC
  Administered 2017-04-24: 650 mg via ORAL

## 2017-04-24 MED ORDER — ONDANSETRON 8 MG PO TBDP: 8.0000 mg | ORAL_TABLET | Freq: Three times a day (TID) | ORAL | 0 refills | Status: DC | PRN

## 2017-04-24 MED ORDER — ACETAMINOPHEN 325 MG PO TABS
ORAL_TABLET | ORAL | Status: AC
  Filled 2017-04-24: qty 2

## 2017-04-24 MED ORDER — OSELTAMIVIR PHOSPHATE 75 MG PO CAPS: 75.0000 mg | ORAL_CAPSULE | Freq: Two times a day (BID) | ORAL | 0 refills | Status: DC

## 2017-04-24 NOTE — ED Triage Notes (Signed)
Pt here for fever and nasal congestion with dizziness that started this am. No mucous. Reports cough.

## 2017-04-24 NOTE — ED Provider Notes (Signed)
[facility]   [id number] 04/24/17 Arrival Time: 1927   SUBJECTIVE:  Casey Newton is a 62 y.o. female who presents to the urgent care with complaint of fever and nasal congestion with dizziness that started this am. No mucous. Reports cough.  Some nausea, no vomiting.  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  Past Medical History:  Diagnosis Date  . Arthritis    left hip  . Hypertension   . OSA on CPAP    Family History  Problem Relation Age of Onset  . Liver disease Mother   . Leukemia Father    Social History   Socioeconomic History  . Marital status: Married    Spouse name: Not on file  . Number of children: Not on file  . Years of education: Not on file  . Highest education level: Not on file  Social Needs  . Financial resource strain: Not on file  . Food insecurity - worry: Not on file  . Food insecurity - inability: Not on file  . Transportation needs - medical: Not on file  . Transportation needs - non-medical: Not on file  Occupational History  . Not on file  Tobacco Use  . Smoking status: Never Smoker  . Smokeless tobacco: Never Used  Substance and Sexual Activity  . Alcohol use: No    Alcohol/week: 0.0 oz  . Drug use: No  . Sexual activity: Not on file  Other Topics Concern  . Not on file  Social History Narrative  . Not on file   No outpatient medications have been marked as taking for the 04/24/17 encounter [facility](Hospital Encounter).   Allergies  Allergen Reactions  . Penicillins Nausea Only and Other (See Comments)    Upset stomach      ROS: As per HPI, remainder of ROS negative.   OBJECTIVE:   Vitals:   04/24/17 1942  BP: (!) 184/94  Pulse: 93  Resp: 18  Temp: 100.3 F (37.9 C)  SpO2: 95%     General appearance: alert; no distress Eyes: PERRL; EOMI; conjunctiva normal HENT: normocephalic; atraumatic; TMs normal, canal normal, external ears normal without trauma; nasal mucosa normal; oral mucosa normal Neck: supple Lungs:  clear to auscultation bilaterally Heart: regular rate and rhythm Back: no CVA tenderness Extremities: no cyanosis or edema; symmetrical with no gross deformities Skin: warm and dry Neurologic: normal gait; grossly normal Psychological: alert and cooperative; normal mood and affect      Labs:  Results for orders placed or performed in visit on 12/18/16  Hemoglobin A1c  Result Value Ref Range   Hgb A1c MFr Bld 5.7 4.6 - 6.5 %  Lipid panel  Result Value Ref Range   Cholesterol 148 0 - 200 mg/dL   Triglycerides 78.268.0 0.0 - 149.0 mg/dL   HDL 95.6261.10 >13.08>39.00 mg/dL   VLDL 65.713.6 0.0 - 84.640.0 mg/dL   LDL Cholesterol 74 0 - 99 mg/dL   Total CHOL/HDL Ratio 2    NonHDL 87.14   Comprehensive metabolic panel  Result Value Ref Range   Sodium 139 135 - 145 mEq/L   Potassium 3.9 3.5 - 5.1 mEq/L   Chloride 101 96 - 112 mEq/L   CO2 33 (H) 19 - 32 mEq/L   Glucose, Bld 91 70 - 99 mg/dL   BUN 11 6 - 23 mg/dL   Creatinine, Ser 9.620.86 0.40 - 1.20 mg/dL   Total Bilirubin 0.5 0.2 - 1.2 mg/dL   Alkaline Phosphatase 111 39 - 117 U/L  AST 22 0 - 37 U/L   ALT 26 0 - 35 U/L   Total Protein 7.2 6.0 - 8.3 g/dL   Albumin 4.2 3.5 - 5.2 g/dL   Calcium 9.5 8.4 - 16.1 mg/dL   GFR 09.60 >45.40 mL/min  CBC  Result Value Ref Range   WBC 4.2 4.0 - 10.5 K/uL   RBC 4.55 3.87 - 5.11 Mil/uL   Platelets 253.0 150.0 - 400.0 K/uL   Hemoglobin 12.5 12.0 - 15.0 g/dL   HCT 98.1 19.1 - 47.8 %   MCV 87.2 78.0 - 100.0 fl   MCHC 31.6 30.0 - 36.0 g/dL   RDW 29.5 62.1 - 30.8 %    Labs Reviewed - No data to display  No results found.     ASSESSMENT & PLAN:  1. Influenza-like illness     Meds ordered this encounter  Medications  . acetaminophen (TYLENOL) tablet 650 mg  . oseltamivir (TAMIFLU) 75 MG capsule    Sig: Take 1 capsule (75 mg total) by mouth every 12 (twelve) hours.    Dispense:  10 capsule    Refill:  0  . ondansetron (ZOFRAN-ODT) 8 MG disintegrating tablet    Sig: Take 1 tablet (8 mg total) by  mouth every 8 (eight) hours as needed for nausea.    Dispense:  12 tablet    Refill:  0    Reviewed expectations re: course of current medical issues. Questions answered. Outlined signs and symptoms indicating need for more acute intervention. Patient verbalized understanding. After Visit Summary given.    Procedures:      Elvina Sidle, MD 04/24/17 2019

## 2017-04-25 ENCOUNTER — Telehealth (HOSPITAL_COMMUNITY): Payer: Self-pay | Admitting: *Deleted

## 2017-04-25 NOTE — Telephone Encounter (Signed)
Pt called stating she is having really bad cough  And needs to know if office providers have any recommendations as to which cough syrup to take over the counter. Staff asked Dr. Carmelia RollerWendling and [facility] with [facility] and they reccommended pt take Robitussin or Dylsum. Staff informed pt with information and she verbalized understanding and agreed with recommendations.

## 2017-10-13 ENCOUNTER — Telehealth: Payer: Self-pay | Admitting: Internal Medicine

## 2017-10-13 NOTE — Telephone Encounter (Signed)
Copied from CRM [phone]. Topic: Quick Communication - See Telephone Encounter >> Oct 13, 2017  3:35 PM Windy KalataMichael, Vester Balthazor L, NT wrote: CRM for notification. See Telephone encounter for: 10/13/17.  Patient is calling and states that at her visit on 12/18/16 she was told that a new cpap prescription would be called in and she states she has not heard from anyone on getting this. Please advise.

## 2017-10-13 NOTE — Telephone Encounter (Signed)
This was quite a long time ago, I do not recall this. Is she still needing a new CPAP? What are her current settings on her machine?

## 2017-10-14 NOTE — Telephone Encounter (Signed)
Called patient and stated she is currently at work so she will call back with her settings. Patient is also inquiring if there is a more compact travel friendly CPAP machine that she can get. States she keeps seeing it advertised and wanted to get one of those instead of the big one she has now.

## 2017-12-21 ENCOUNTER — Ambulatory Visit (INDEPENDENT_AMBULATORY_CARE_PROVIDER_SITE_OTHER): Payer: BLUE CROSS/BLUE SHIELD | Admitting: Internal Medicine

## 2017-12-21 ENCOUNTER — Encounter: Payer: Self-pay | Admitting: Internal Medicine

## 2017-12-21 ENCOUNTER — Other Ambulatory Visit (INDEPENDENT_AMBULATORY_CARE_PROVIDER_SITE_OTHER): Payer: BLUE CROSS/BLUE SHIELD

## 2017-12-21 VITALS — BP 170/100 | HR 65 | Temp 98.4°F | Ht 61.0 in | Wt 212.0 lb

## 2017-12-21 DIAGNOSIS — G4733 Obstructive sleep apnea (adult) (pediatric): Secondary | ICD-10-CM | POA: Diagnosis not present

## 2017-12-21 DIAGNOSIS — I10 Essential (primary) hypertension: Secondary | ICD-10-CM

## 2017-12-21 DIAGNOSIS — Z1211 Encounter for screening for malignant neoplasm of colon: Secondary | ICD-10-CM

## 2017-12-21 DIAGNOSIS — Z0000 Encounter for general adult medical examination without abnormal findings: Secondary | ICD-10-CM

## 2017-12-21 DIAGNOSIS — Z23 Encounter for immunization: Secondary | ICD-10-CM | POA: Diagnosis not present

## 2017-12-21 DIAGNOSIS — Z9989 Dependence on other enabling machines and devices: Secondary | ICD-10-CM

## 2017-12-21 LAB — COMPREHENSIVE METABOLIC PANEL
ALT: 21 U/L (ref 0–35)
AST: 18 U/L (ref 0–37)
Albumin: 4 g/dL (ref 3.5–5.2)
Alkaline Phosphatase: 120 U/L — ABNORMAL HIGH (ref 39–117)
BILIRUBIN TOTAL: 0.3 mg/dL (ref 0.2–1.2)
BUN: 13 mg/dL (ref 6–23)
CALCIUM: 9.2 mg/dL (ref 8.4–10.5)
CHLORIDE: 103 meq/L (ref 96–112)
CO2: 31 meq/L (ref 19–32)
Creatinine, Ser: 0.97 mg/dL (ref 0.40–1.20)
GFR: 74.58 mL/min (ref >60.00)
Glucose, Bld: 99 mg/dL (ref 70–99)
Potassium: 4.2 mEq/L (ref 3.5–5.1)
Sodium: 139 mEq/L (ref 135–145)
Total Protein: 6.9 g/dL (ref 6.0–8.3)

## 2017-12-21 LAB — LIPID PANEL
CHOL/HDL RATIO: 3
Cholesterol: 146 mg/dL (ref 0–200)
HDL: 56.1 mg/dL (ref >39.00)
LDL Cholesterol: 67 mg/dL (ref 0–99)
NonHDL: 89.46
TRIGLYCERIDES: 112 mg/dL (ref 0.0–149.0)
VLDL: 22.4 mg/dL (ref 0.0–40.0)

## 2017-12-21 LAB — CBC
HEMATOCRIT: 39.2 % (ref 36.0–46.0)
HEMOGLOBIN: 12.7 g/dL (ref 12.0–15.0)
MCHC: 32.5 g/dL (ref 30.0–36.0)
MCV: 86.2 fl (ref 78.0–100.0)
PLATELETS: 253 10*3/uL (ref 150.0–400.0)
RBC: 4.54 Mil/uL (ref 3.87–5.11)
RDW: 14.7 % (ref 11.5–15.5)
WBC: 5.7 10*3/uL (ref 4.0–10.5)

## 2017-12-21 LAB — HEMOGLOBIN A1C: Hgb A1c MFr Bld: 5.7 % (ref 4.6–6.5)

## 2017-12-21 MED ORDER — TRIAMCINOLONE ACETONIDE 0.1 % EX CREA: 1.0000 "application " | TOPICAL_CREAM | Freq: Two times a day (BID) | CUTANEOUS | 3 refills | Status: DC

## 2017-12-21 MED ORDER — TELMISARTAN-HCTZ 80-12.5 MG PO TABS: 1.0000 | ORAL_TABLET | Freq: Every day | ORAL | 3 refills | Status: DC

## 2017-12-21 NOTE — Patient Instructions (Signed)
We have sent in telmisartan/hctz to replace the losartan medicine.   We have sent in the cream and put in for the new cpap.   Health Maintenance, Female Adopting a healthy lifestyle and getting preventive care can go a long way to promote health and wellness. Talk with your health care provider about what schedule of regular examinations is right for you. This is a good chance for you to check in with your provider about disease prevention and staying healthy. In between checkups, there are plenty of things you can do on your own. Experts have done a lot of research about which lifestyle changes and preventive measures are most likely to keep you healthy. Ask your health care provider for more information. Weight and diet Eat a healthy diet  Be sure to include plenty of vegetables, fruits, low-fat dairy products, and lean protein.  Do not eat a lot of foods high in solid fats, added sugars, or salt.  Get regular exercise. This is one of the most important things you can do for your health. ? Most adults should exercise for at least 150 minutes each week. The exercise should increase your heart rate and make you sweat (moderate-intensity exercise). ? Most adults should also do strengthening exercises at least twice a week. This is in addition to the moderate-intensity exercise.  Maintain a healthy weight  Body mass index (BMI) is a measurement that can be used to identify possible weight problems. It estimates body fat based on height and weight. Your health care provider can help determine your BMI and help you achieve or maintain a healthy weight.  For females 93 years of age and older: ? A BMI below 18.5 is considered underweight. ? A BMI of 18.5 to 24.9 is normal. ? A BMI of 25 to 29.9 is considered overweight. ? A BMI of 30 and above is considered obese.  Watch levels of cholesterol and blood lipids  You should start having your blood tested for lipids and cholesterol at 63 years of  age, then have this test every 5 years.  You may need to have your cholesterol levels checked more often if: ? Your lipid or cholesterol levels are high. ? You are older than 63 years of age. ? You are at high risk for heart disease.  Cancer screening Lung Cancer  Lung cancer screening is recommended for adults 28-40 years old who are at high risk for lung cancer because of a history of smoking.  A yearly low-dose CT scan of the lungs is recommended for people who: ? Currently smoke. ? Have quit within the past 15 years. ? Have at least a 30-pack-year history of smoking. A pack year is smoking an average of one pack of cigarettes a day for 1 year.  Yearly screening should continue until it has been 15 years since you quit.  Yearly screening should stop if you develop a health problem that would prevent you from having lung cancer treatment.  Breast Cancer  Practice breast self-awareness. This means understanding how your breasts normally appear and feel.  It also means doing regular breast self-exams. Let your health care provider know about any changes, no matter how small.  If you are in your 20s or 30s, you should have a clinical breast exam (CBE) by a health care provider every 1-3 years as part of a regular health exam.  If you are 29 or older, have a CBE every year. Also consider having a breast X-ray (mammogram) every  year.  If you have a family history of breast cancer, talk to your health care provider about genetic screening.  If you are at high risk for breast cancer, talk to your health care provider about having an MRI and a mammogram every year.  Breast cancer gene (BRCA) assessment is recommended for women who have family members with BRCA-related cancers. BRCA-related cancers include: ? Breast. ? Ovarian. ? Tubal. ? Peritoneal cancers.  Results of the assessment will determine the need for genetic counseling and BRCA1 and BRCA2 testing.  Cervical  Cancer Your health care provider may recommend that you be screened regularly for cancer of the pelvic organs (ovaries, uterus, and vagina). This screening involves a pelvic examination, including checking for microscopic changes to the surface of your cervix (Pap test). You may be encouraged to have this screening done every 3 years, beginning at age 27.  For women ages 26-65, health care providers may recommend pelvic exams and Pap testing every 3 years, or they may recommend the Pap and pelvic exam, combined with testing for human papilloma virus (HPV), every 5 years. Some types of HPV increase your risk of cervical cancer. Testing for HPV may also be done on women of any age with unclear Pap test results.  Other health care providers may not recommend any screening for nonpregnant women who are considered low risk for pelvic cancer and who do not have symptoms. Ask your health care provider if a screening pelvic exam is right for you.  If you have had past treatment for cervical cancer or a condition that could lead to cancer, you need Pap tests and screening for cancer for at least 20 years after your treatment. If Pap tests have been discontinued, your risk factors (such as having a new sexual partner) need to be reassessed to determine if screening should resume. Some women have medical problems that increase the chance of getting cervical cancer. In these cases, your health care provider may recommend more frequent screening and Pap tests.  Colorectal Cancer  This type of cancer can be detected and often prevented.  Routine colorectal cancer screening usually begins at 63 years of age and continues through 63 years of age.  Your health care provider may recommend screening at an earlier age if you have risk factors for colon cancer.  Your health care provider may also recommend using home test kits to check for hidden blood in the stool.  A small camera at the end of a tube can be used to  examine your colon directly (sigmoidoscopy or colonoscopy). This is done to check for the earliest forms of colorectal cancer.  Routine screening usually begins at age 16.  Direct examination of the colon should be repeated every 5-10 years through 63 years of age. However, you may need to be screened more often if early forms of precancerous polyps or small growths are found.  Skin Cancer  Check your skin from head to toe regularly.  Tell your health care provider about any new moles or changes in moles, especially if there is a change in a mole's shape or color.  Also tell your health care provider if you have a mole that is larger than the size of a pencil eraser.  Always use sunscreen. Apply sunscreen liberally and repeatedly throughout the day.  Protect yourself by wearing long sleeves, pants, a wide-brimmed hat, and sunglasses whenever you are outside.  Heart disease, diabetes, and high blood pressure  High blood pressure causes heart  disease and increases the risk of stroke. High blood pressure is more likely to develop in: ? People who have blood pressure in the high end of the normal range (130-139/85-89 mm Hg). ? People who are overweight or obese. ? People who are African American.  If you are 68-78 years of age, have your blood pressure checked every 3-5 years. If you are 61 years of age or older, have your blood pressure checked every year. You should have your blood pressure measured twice-once when you are at a hospital or clinic, and once when you are not at a hospital or clinic. Record the average of the two measurements. To check your blood pressure when you are not at a hospital or clinic, you can use: ? An automated blood pressure machine at a pharmacy. ? A home blood pressure monitor.  If you are between 45 years and 15 years old, ask your health care provider if you should take aspirin to prevent strokes.  Have regular diabetes screenings. This involves taking a  blood sample to check your fasting blood sugar level. ? If you are at a normal weight and have a low risk for diabetes, have this test once every three years after 63 years of age. ? If you are overweight and have a high risk for diabetes, consider being tested at a younger age or more often. Preventing infection Hepatitis B  If you have a higher risk for hepatitis B, you should be screened for this virus. You are considered at high risk for hepatitis B if: ? You were born in a country where hepatitis B is common. Ask your health care provider which countries are considered high risk. ? Your parents were born in a high-risk country, and you have not been immunized against hepatitis B (hepatitis B vaccine). ? You have HIV or AIDS. ? You use needles to inject street drugs. ? You live with someone who has hepatitis B. ? You have had sex with someone who has hepatitis B. ? You get hemodialysis treatment. ? You take certain medicines for conditions, including cancer, organ transplantation, and autoimmune conditions.  Hepatitis C  Blood testing is recommended for: ? Everyone born from [birth year] through 1966. ? Anyone with known risk factors for hepatitis C.  Sexually transmitted infections (STIs)  You should be screened for sexually transmitted infections (STIs) including gonorrhea and chlamydia if: ? You are sexually active and are younger than 63 years of age. ? You are older than 63 years of age and your health care provider tells you that you are at risk for this type of infection. ? Your sexual activity has changed since you were last screened and you are at an increased risk for chlamydia or gonorrhea. Ask your health care provider if you are at risk.  If you do not have HIV, but are at risk, it may be recommended that you take a prescription medicine daily to prevent HIV infection. This is called pre-exposure prophylaxis (PrEP). You are considered at risk if: ? You are sexually active and  do not regularly use condoms or know the HIV status of your partner(s). ? You take drugs by injection. ? You are sexually active with a partner who has HIV.  Talk with your health care provider about whether you are at high risk of being infected with HIV. If you choose to begin PrEP, you should first be tested for HIV. You should then be tested every 3 months for as long as you  are taking PrEP. Pregnancy  If you are premenopausal and you may become pregnant, ask your health care provider about preconception counseling.  If you may become pregnant, take 400 to 800 micrograms (mcg) of folic acid every day.  If you want to prevent pregnancy, talk to your health care provider about birth control (contraception). Osteoporosis and menopause  Osteoporosis is a disease in which the bones lose minerals and strength with aging. This can result in serious bone fractures. Your risk for osteoporosis can be identified using a bone density scan.  If you are 58 years of age or older, or if you are at risk for osteoporosis and fractures, ask your health care provider if you should be screened.  Ask your health care provider whether you should take a calcium or vitamin D supplement to lower your risk for osteoporosis.  Menopause may have certain physical symptoms and risks.  Hormone replacement therapy may reduce some of these symptoms and risks. Talk to your health care provider about whether hormone replacement therapy is right for you. Follow these instructions at home:  Schedule regular health, dental, and eye exams.  Stay current with your immunizations.  Do not use any tobacco products including cigarettes, chewing tobacco, or electronic cigarettes.  If you are pregnant, do not drink alcohol.  If you are breastfeeding, limit how much and how often you drink alcohol.  Limit alcohol intake to no more than 1 drink per day for nonpregnant women. One drink equals 12 ounces of beer, 5 ounces of  wine, or 1 ounces of hard liquor.  Do not use street drugs.  Do not share needles.  Ask your health care provider for help if you need support or information about quitting drugs.  Tell your health care provider if you often feel depressed.  Tell your health care provider if you have ever been abused or do not feel safe at home. This information is not intended to replace advice given to you by your health care provider. Make sure you discuss any questions you have with your health care provider. Document Released: 09/02/2010 Document Revised: 07/26/2015 Document Reviewed: 11/21/2014 Elsevier Interactive Patient Education  [employer].

## 2017-12-21 NOTE — Progress Notes (Signed)
   Subjective:    Patient ID: Casey Newton, female    DOB: 01-02-1956, 62 y.o.   MRN: [id number]  HPI  The patient is a 62 YO female coming in for physical. Out of BP meds for about 1 month due to recall on losartan/hctz and did not call.   PMH, [facility], social history reviewed and updated. Needs new cpap machine and about 5 years or so ago.   Review of Systems  Constitutional: Negative.   HENT: Negative.   Eyes: Negative.   Respiratory: Negative for cough, chest tightness and shortness of breath.   Cardiovascular: Negative for chest pain, palpitations and leg swelling.  Gastrointestinal: Negative for abdominal distention, abdominal pain, constipation, diarrhea, nausea and vomiting.  Musculoskeletal: Negative.   Skin: Negative.   Neurological: Negative.   Psychiatric/Behavioral: Negative.       Objective:   Physical Exam  Constitutional: She is oriented to person, place, and time. She appears well-developed and well-nourished.  HENT:  Head: Normocephalic and atraumatic.  Eyes: EOM are normal.  Neck: Normal range of motion.  Cardiovascular: Normal rate and regular rhythm.  Pulmonary/Chest: Effort normal and breath sounds normal. No respiratory distress. She has no wheezes. She has no rales.  Abdominal: Soft. Bowel sounds are normal. She exhibits no distension. There is no tenderness. There is no rebound.  Musculoskeletal: She exhibits no edema.  Neurological: She is alert and oriented to person, place, and time. Coordination normal.  Skin: Skin is warm and dry.  Psychiatric: She has a normal mood and affect.   Vitals:   12/21/17 1501 12/21/17 1601  BP: (!) 170/100 (!) 170/100  Pulse: 65   Temp: 98.4 F (36.9 C)   TempSrc: Oral   SpO2: 99%   Weight: 212 lb (96.2 kg)   Height: 5\' 1"  (1.549 m)       Assessment & Plan:  Tdap given at visit

## 2017-12-22 NOTE — Assessment & Plan Note (Signed)
Replaced with telmisartan/hctz and previously BP at goal on meds. Asked patient to come back in about 6 months instead of 1 year. Check BP at home and call with problems. Checking CMP and adjust as needed.

## 2017-12-22 NOTE — Assessment & Plan Note (Signed)
BMI increasing and she is not ready to work on weight at this time. Not exercising.

## 2017-12-22 NOTE — Assessment & Plan Note (Signed)
Flu shot declined. Shingrix counseled. Tetanus given. Cologuard ordered. Mammogram up to date. Counseled about sun safety and mole surveillance. Counseled about the dangers of distracted driving. Given 10 year screening recommendations.

## 2017-12-22 NOTE — Assessment & Plan Note (Signed)
On CPAP and new machine ordered.

## 2018-05-27 DIAGNOSIS — G4733 Obstructive sleep apnea (adult) (pediatric): Secondary | ICD-10-CM | POA: Diagnosis not present

## 2018-06-18 ENCOUNTER — Ambulatory Visit (INDEPENDENT_AMBULATORY_CARE_PROVIDER_SITE_OTHER): Payer: BLUE CROSS/BLUE SHIELD | Admitting: Internal Medicine

## 2018-06-18 ENCOUNTER — Encounter: Payer: Self-pay | Admitting: Internal Medicine

## 2018-06-18 DIAGNOSIS — Z9989 Dependence on other enabling machines and devices: Secondary | ICD-10-CM | POA: Diagnosis not present

## 2018-06-18 DIAGNOSIS — G4733 Obstructive sleep apnea (adult) (pediatric): Secondary | ICD-10-CM | POA: Diagnosis not present

## 2018-06-18 DIAGNOSIS — I10 Essential (primary) hypertension: Secondary | ICD-10-CM | POA: Diagnosis not present

## 2018-06-18 NOTE — Assessment & Plan Note (Signed)
Continue micardis hctz and monitor BP. Labs with next visit.

## 2018-06-18 NOTE — Assessment & Plan Note (Signed)
On CPAP and [facility] rx [facility] per patient request. Reminded to use nightly and for more than 6 hours nightly.

## 2018-06-18 NOTE — Progress Notes (Signed)
Virtual Visit via Video Note  I connected with Casey Newton on 06/18/18 at  9:00 AM EDT by a video enabled telemedicine application and verified that I am speaking with the correct person using two identifiers.   I discussed the limitations of evaluation and management by telemedicine and the availability of in person appointments. The patient expressed understanding and agreed to proceed.  History of Present Illness: The patient is a 63 y.o. female with visit for follow up of her blood pressure. Has been taking her micardis hct without missing doses. Denies headaches or chest pains. Overall it is doing well. Did have some mild dizziness when starting this (was changed due to recall) but this is gone now. Has tried doing yoga but with the pandemic has not been as motivated to do this and is up a couple of pounds. Denies significant anxiety or depression and is overall coping well. Still working at [employer] and also working from home. She needs a new rx for CPAP with [facility] as she is wanting to use this again.  BP 119/78  Observations/Objective: Appearance: normal, breathing appears normal, casual grooming, abdomen does not appear distended, throat normal, memory good, mental status is A and O times 3  Assessment and Plan: See problem oriented charting  Follow Up Instructions: continue micardis HCTZ, new rx for CPAP to [facility]  I discussed the assessment and treatment plan with the patient. The patient was provided an opportunity to ask questions and all were answered. The patient agreed with the plan and demonstrated an understanding of the instructions.   The patient was advised to call back or seek an in-person evaluation if the symptoms worsen or if the condition fails to improve as anticipated.  Myrlene Broker, MD

## 2018-06-25 ENCOUNTER — Ambulatory Visit: Payer: BLUE CROSS/BLUE SHIELD | Admitting: Internal Medicine

## 2018-07-02 ENCOUNTER — Encounter: Payer: Self-pay | Admitting: Internal Medicine

## 2018-07-02 ENCOUNTER — Ambulatory Visit (INDEPENDENT_AMBULATORY_CARE_PROVIDER_SITE_OTHER): Payer: BLUE CROSS/BLUE SHIELD | Admitting: Internal Medicine

## 2018-07-02 DIAGNOSIS — R21 Rash and other nonspecific skin eruption: Secondary | ICD-10-CM | POA: Diagnosis not present

## 2018-07-02 MED ORDER — BETAMETHASONE VALERATE 0.1 % EX OINT: 1.0000 "application " | TOPICAL_OINTMENT | Freq: Two times a day (BID) | CUTANEOUS | 0 refills | Status: AC

## 2018-07-02 NOTE — Assessment & Plan Note (Signed)
Rx for betamethasone ointment for the rash. If no improvement will call in prednisone.

## 2018-07-02 NOTE — Progress Notes (Signed)
Virtual Visit via Video Note  I connected with Casey Newton on 07/02/18 at  1:00 PM EDT by a video enabled telemedicine application and verified that I am speaking with the correct person using two identifiers.   I discussed the limitations of evaluation and management by telemedicine and the availability of in person appointments. The patient expressed understanding and agreed to proceed.  History of Present Illness: The patient is a 63 y.o. female with visit for rash from likely poison ivy. Started this last weekend about 5 days ago. Rash is on her chin and stomach. Some spreading over the last few days. Tried triamcinolone ointment on it which helped mildly but has not gone away. Still spreading some. Itching and she is trying not to scratch. Has not tried benadryl but has some at home. Denies fevers or chills or SOB. Overall it is worsening.  Observations/Objective: Appearance: normal, breathing appears normal, rash around the face and on the stomach consistent with poison ivy/oak, casual grooming, abdomen does not appear distended, throat normal, mental status is A and O times 3  Assessment and Plan: See problem oriented charting  Follow Up Instructions: rx for betamethasone ointment.   I discussed the assessment and treatment plan with the patient. The patient was provided an opportunity to ask questions and all were answered. The patient agreed with the plan and demonstrated an understanding of the instructions.   The patient was advised to call back or seek an in-person evaluation if the symptoms worsen or if the condition fails to improve as anticipated.  Myrlene Broker, MD

## 2018-07-16 ENCOUNTER — Ambulatory Visit: Payer: Self-pay | Admitting: *Deleted

## 2018-07-16 MED ORDER — PREDNISONE 20 MG PO TABS: 40.0000 mg | ORAL_TABLET | Freq: Every day | ORAL | 0 refills | Status: DC

## 2018-07-16 NOTE — Addendum Note (Signed)
Addended by: Hillard Danker A on: 07/16/2018 02:57 PM   Modules accepted: Orders

## 2018-07-16 NOTE — Telephone Encounter (Signed)
Sent in prednisone

## 2018-07-16 NOTE — Telephone Encounter (Signed)
Patient informed. 

## 2018-07-16 NOTE — Telephone Encounter (Signed)
Summary: not better    Pt was seen for poison ivy. She was given cream, but the rash is still spreading. The cream is helping, but is spreading. Redness and small bumps. Please call to advise     Medication seems to help area where applied- but patient has other areas appearing close to the original area. Patient states she has not needed the Benadryl since a day after visit- it makes her sleepy. Patient states she has areas below lip and hand/arm, below the breast-bottom of cleavage- seems to be moving downward. [facility]  Reason for Disposition . Caller has URGENT medication question about med that PCP prescribed and triager unable to answer question  Answer Assessment - Initial Assessment Questions 1. APPEARANCE of RASH: "Describe the rash."      Red-raised bumps 2. LOCATION: "Where is the rash located?"      Same area as treatment- but outside of treated area- face- below lip, below breast/clevage area, new- hand/small area on arm  Patient was seen for rash and is using cream for treatment- OV states if no better- may have to Rx oral steriod. Note sent for review.  Protocols used: MEDICATION QUESTION CALL-A-AH, RASH OR REDNESS - LOCALIZED-A-AH

## 2018-07-30 ENCOUNTER — Telehealth: Payer: Self-pay | Admitting: Internal Medicine

## 2018-07-30 NOTE — Telephone Encounter (Unsigned)
Copied from CRM [id number]. Topic: Quick Communication - Rx Refill/Question >> Jul 30, 2018  2:19 PM Gwenlyn Fudge A wrote: Medication: predniSONE (DELTASONE) 20 MG tablet   Has the patient contacted their pharmacy? No. Pt states the rash started to come back again two days ago. Please advise.  (Agent: If no, request that the patient contact the pharmacy for the refill.) (Agent: If yes, when and what did the pharmacy advise?)  Preferred Pharmacy (with phone number or street name): [id number] [place], [place] - [address] [place] [address] Phone: [phone] Fax: [phone] Not a 24 hour pharmacy; exact hours not known.    Agent: Please be advised that RX refills may take up to 3 business days. We ask that you follow-up with your pharmacy.

## 2018-07-30 NOTE — Telephone Encounter (Signed)
Would recommend to use ointment and not scratch.

## 2018-07-30 NOTE — Telephone Encounter (Signed)
Called patient and she states she has already been using the ointment the past two days and has not helped at all rash is on her face and now on her arms. Will continue to use the ointment over the weekend and if it gets too bad will call Saturday clinic

## 2018-08-02 NOTE — Telephone Encounter (Signed)
Can have in office visit if needed if no improvement

## 2018-08-02 NOTE — Telephone Encounter (Signed)
Called patient and states the rash on her arms seem to be drying up but on her face is still there. Patient wanted to give some time to see if it will go away by its self. Scheduled appointment for next Friday and if it does go away then will cancel

## 2018-08-13 ENCOUNTER — Ambulatory Visit (INDEPENDENT_AMBULATORY_CARE_PROVIDER_SITE_OTHER): Payer: BC Managed Care – PPO | Admitting: Internal Medicine

## 2018-08-13 ENCOUNTER — Other Ambulatory Visit: Payer: Self-pay

## 2018-08-13 ENCOUNTER — Ambulatory Visit: Payer: BLUE CROSS/BLUE SHIELD | Admitting: Internal Medicine

## 2018-08-13 ENCOUNTER — Encounter: Payer: Self-pay | Admitting: Internal Medicine

## 2018-08-13 DIAGNOSIS — M25572 Pain in left ankle and joints of left foot: Secondary | ICD-10-CM | POA: Diagnosis not present

## 2018-08-13 NOTE — Assessment & Plan Note (Signed)
Suspect strain from shoes and advised RICE and tylenol if needed for pain. Should resolve in 2-3 weeks. Call back for worsening or lack of improvement. No indication for imaging today.

## 2018-08-13 NOTE — Patient Instructions (Addendum)
Ask your dentist about an oral appliance for sleep apnea.

## 2018-08-13 NOTE — Progress Notes (Signed)
   Subjective:   Patient ID: Casey Newton, female    DOB: [birth year], 63 y.o.   MRN: [id number]  HPI The patient is a 63 YO female coming in for left ankle pain and swelling. Started 1 day ago. She wore a different pair of shoes which were wedges on Monday. Denies injury or twisting that she can recall. No falls. Not walking a significantly different amount. Denies fevers or chills. Denies rash or redness. Pain is 1-2/10 and she has not taken anything for it. Swelling is mild and she has tried ice for this.   Review of Systems  Constitutional: Negative.   Respiratory: Negative for cough, chest tightness and shortness of breath.   Cardiovascular: Negative for chest pain, palpitations and leg swelling.  Gastrointestinal: Negative for abdominal distention, abdominal pain, constipation, diarrhea, nausea and vomiting.  Musculoskeletal: Positive for arthralgias, joint swelling and myalgias.  Skin: Negative.   Neurological: Negative.   Psychiatric/Behavioral: Negative.     Objective:  Physical Exam Constitutional:      Appearance: She is well-developed.  HENT:     Head: Normocephalic and atraumatic.  Neck:     Musculoskeletal: Normal range of motion.  Cardiovascular:     Rate and Rhythm: Normal rate and regular rhythm.  Pulmonary:     Effort: Pulmonary effort is normal. No respiratory distress.     Breath sounds: Normal breath sounds. No wheezing or rales.  Abdominal:     General: Bowel sounds are normal. There is no distension.     Palpations: Abdomen is soft.     Tenderness: There is no abdominal tenderness. There is no rebound.  Musculoskeletal:        General: Tenderness present.     Comments: Minimal tenderness left ankle lateral with full ROM, able to walk without pain or deformity. Trace swelling which is not pitting.   Skin:    General: Skin is warm and dry.  Neurological:     Mental Status: She is alert and oriented to person, place, and time.     Coordination:  Coordination normal.     Comments: Wearing flip flops     Vitals:   08/13/18 1414  BP: 140/90  Pulse: 73  Temp: 98.7 F (37.1 C)  TempSrc: Oral  SpO2: 97%  Weight: 213 lb (96.6 kg)  Height: 5\' 1"  (1.549 m)    Assessment & Plan:

## 2018-08-31 ENCOUNTER — Ambulatory Visit: Payer: Self-pay | Admitting: *Deleted

## 2018-08-31 NOTE — Telephone Encounter (Signed)
appt scheduled for 7/1 °

## 2018-08-31 NOTE — Telephone Encounter (Signed)
Patient has been having increasing dizziness over the last 2 weeks- off and on. Patient has noticed an increase in her BP as well and she believes that her BP medication is not working to control her BP. Patient denies edema, chest pain or breathing changes.  Call to office for appointment.  Reason for Disposition . [1] MODERATE dizziness (e.g., interferes with normal activities) AND [2] has NOT been evaluated by physician for this  (Exception: dizziness caused by heat exposure, sudden standing, or poor fluid intake)  Answer Assessment - Initial Assessment Questions 1. DESCRIPTION: "Describe your dizziness."     Foggy, room is swimming 2. LIGHTHEADED: "Do you feel lightheaded?" (e.g., somewhat faint, woozy, weak upon standing)     Pressure across front- standing causes off balance 3. VERTIGO: "Do you feel like either you or the room is spinning or tilting?" (i.e. vertigo)     no 4. SEVERITY: "How bad is it?"  "Do you feel like you are going to faint?" "Can you stand and walk?"   - MILD - walking normally   - MODERATE - interferes with normal activities (e.g., work, school)    - SEVERE - unable to stand, requires support to walk, feels like passing out now.      Mild/moderate 5. ONSET:  "When did the dizziness begin?"     2 weeks ago- on/off 6. AGGRAVATING FACTORS: "Does anything make it worse?" (e.g., standing, change in head position)     standing 7. HEART RATE: "Can you tell me your heart rate?" "How many beats in 15 seconds?"  (Note: not all patients can do this)       P 59 8. CAUSE: "What do you think is causing the dizziness?"     High BP- 179/92 9. RECURRENT SYMPTOM: "Have you had dizziness before?" If so, ask: "When was the last time?" "What happened that time?"     Not really- 3-4 years ago- 1 episode no f/u 10. OTHER SYMPTOMS: "Do you have any other symptoms?" (e.g., fever, chest pain, vomiting, diarrhea, bleeding)       Elevated BP- yesterday- 177/90, 157/83 11. PREGNANCY: "Is  there any chance you are pregnant?" "When was your last menstrual period?"       n/a  Protocols used: DIZZINESS [facility]

## 2018-09-01 ENCOUNTER — Ambulatory Visit (INDEPENDENT_AMBULATORY_CARE_PROVIDER_SITE_OTHER): Payer: BC Managed Care – PPO | Admitting: Internal Medicine

## 2018-09-01 ENCOUNTER — Encounter: Payer: Self-pay | Admitting: Internal Medicine

## 2018-09-01 ENCOUNTER — Other Ambulatory Visit (INDEPENDENT_AMBULATORY_CARE_PROVIDER_SITE_OTHER): Payer: BC Managed Care – PPO

## 2018-09-01 DIAGNOSIS — R42 Dizziness and giddiness: Secondary | ICD-10-CM | POA: Diagnosis not present

## 2018-09-01 LAB — COMPREHENSIVE METABOLIC PANEL
ALT: 14 U/L (ref 0–35)
AST: 14 U/L (ref 0–37)
Albumin: 4.5 g/dL (ref 3.5–5.2)
Alkaline Phosphatase: 127 U/L — ABNORMAL HIGH (ref 39–117)
BUN: 11 mg/dL (ref 6–23)
CO2: 28 mEq/L (ref 19–32)
Calcium: 9.6 mg/dL (ref 8.4–10.5)
Chloride: 101 mEq/L (ref 96–112)
Creatinine, Ser: 0.93 mg/dL (ref 0.40–1.20)
GFR: 73.5 mL/min (ref >60.00)
Glucose, Bld: 87 mg/dL (ref 70–99)
Potassium: 3.7 mEq/L (ref 3.5–5.1)
Sodium: 139 mEq/L (ref 135–145)
Total Bilirubin: 0.5 mg/dL (ref 0.2–1.2)
Total Protein: 7.6 g/dL (ref 6.0–8.3)

## 2018-09-01 LAB — CBC
HCT: 39.7 % (ref 36.0–46.0)
Hemoglobin: 12.7 g/dL (ref 12.0–15.0)
MCHC: 32 g/dL (ref 30.0–36.0)
MCV: 86.8 fl (ref 78.0–100.0)
Platelets: 259 10*3/uL (ref 150.0–400.0)
RBC: 4.57 Mil/uL (ref 3.87–5.11)
RDW: 14.1 % (ref 11.5–15.5)
WBC: 5.2 10*3/uL (ref 4.0–10.5)

## 2018-09-01 LAB — FERRITIN: Ferritin: 133.9 ng/mL (ref 10.0–291.0)

## 2018-09-01 NOTE — Progress Notes (Signed)
Virtual Visit via Video Note  I connected with Casey Newton on 09/01/18 at  9:20 AM EDT by a video enabled telemedicine application and verified that I am speaking with the correct person using two identifiers.  The patient and the provider were at separate locations throughout the entire encounter.   I discussed the limitations of evaluation and management by telemedicine and the availability of in person appointments. The patient expressed understanding and agreed to proceed.  History of Present Illness: The patient is a 63 y.o. female with visit for dizziness. Started yesterday and is some improved today. Has no chest pains or SOB or cough. Denies sinus pressure or congestion. Did decrease soda intake over the weekend. Denies other changes in diet. Has not been outdoors much or exercising outdoors. Denies GI symptoms such as diarrhea. Overall it is improving today without intervention. Denies that the room was spinning. Was more lightheaded especially with standing. She just laid down most of the day yesterday. Has tried nothing.   Observations/Objective: Appearance: normal, breathing appears normal, casual grooming, abdomen does not appear distended, throat normal, no coughing during visit, memory normal, mental status is A and O times 3 153/75 this morning, pulse 53  Assessment and Plan: See problem oriented charting  Follow Up Instructions: check labs, monitor symptoms.   I discussed the assessment and treatment plan with the patient. The patient was provided an opportunity to ask questions and all were answered. The patient agreed with the plan and demonstrated an understanding of the instructions.   The patient was advised to call back or seek an in-person evaluation if the symptoms worsen or if the condition fails to improve as anticipated.  Hoyt Koch, MD

## 2018-09-01 NOTE — Assessment & Plan Note (Signed)
Denies dark stools or other symptoms. Checking CBC, CMP, ferritin to assess cause. Monitor symptoms for recurrence and push fluids.

## 2018-11-25 ENCOUNTER — Other Ambulatory Visit: Payer: Self-pay | Admitting: Internal Medicine

## 2018-11-25 DIAGNOSIS — Z1231 Encounter for screening mammogram for malignant neoplasm of breast: Secondary | ICD-10-CM

## 2018-12-06 ENCOUNTER — Telehealth: Payer: Self-pay

## 2018-12-06 NOTE — Telephone Encounter (Signed)
Patient states that she went and got a test and will let us know what the results are when they come in.

## 2018-12-06 NOTE — Telephone Encounter (Signed)
Received [facility] where patient states she was exposed to a known positive and is not having any symptoms but is wondering what she should do

## 2018-12-06 NOTE — Telephone Encounter (Signed)
If exposed to known positive can send for testing.

## 2018-12-23 DIAGNOSIS — G4733 Obstructive sleep apnea (adult) (pediatric): Secondary | ICD-10-CM | POA: Diagnosis not present

## 2019-01-07 ENCOUNTER — Ambulatory Visit
Admission: RE | Admit: 2019-01-07 | Discharge: 2019-01-07 | Disposition: A | Discharge status: Home / Self Care | Payer: BC Managed Care – PPO | Source: Ambulatory Visit | Attending: Internal Medicine | Admitting: Internal Medicine

## 2019-01-07 ENCOUNTER — Other Ambulatory Visit: Payer: Self-pay

## 2019-01-07 DIAGNOSIS — Z1231 Encounter for screening mammogram for malignant neoplasm of breast: Secondary | ICD-10-CM

## 2019-02-14 ENCOUNTER — Other Ambulatory Visit: Payer: Self-pay | Admitting: Internal Medicine

## 2019-03-25 DIAGNOSIS — G4733 Obstructive sleep apnea (adult) (pediatric): Secondary | ICD-10-CM | POA: Diagnosis not present

## 2019-05-15 ENCOUNTER — Other Ambulatory Visit: Payer: Self-pay | Admitting: Internal Medicine

## 2019-07-08 ENCOUNTER — Other Ambulatory Visit: Payer: Self-pay

## 2019-07-08 ENCOUNTER — Ambulatory Visit (INDEPENDENT_AMBULATORY_CARE_PROVIDER_SITE_OTHER): Payer: BC Managed Care – PPO | Admitting: Internal Medicine

## 2019-07-08 ENCOUNTER — Encounter: Payer: Self-pay | Admitting: Internal Medicine

## 2019-07-08 ENCOUNTER — Telehealth: Payer: Self-pay | Admitting: Radiology

## 2019-07-08 ENCOUNTER — Other Ambulatory Visit: Payer: Self-pay | Admitting: Internal Medicine

## 2019-07-08 VITALS — BP 138/82 | HR 52 | Temp 98.3°F | Ht 61.0 in | Wt 217.6 lb

## 2019-07-08 DIAGNOSIS — R42 Dizziness and giddiness: Secondary | ICD-10-CM

## 2019-07-08 DIAGNOSIS — I10 Essential (primary) hypertension: Secondary | ICD-10-CM

## 2019-07-08 DIAGNOSIS — Z0000 Encounter for general adult medical examination without abnormal findings: Secondary | ICD-10-CM

## 2019-07-08 DIAGNOSIS — Z0001 Encounter for general adult medical examination with abnormal findings: Secondary | ICD-10-CM | POA: Diagnosis not present

## 2019-07-08 LAB — LIPID PANEL
Cholesterol: 152 mg/dL (ref 0–200)
HDL: 50.9 mg/dL (ref >39.00)
LDL Cholesterol: 86 mg/dL (ref 0–99)
NonHDL: 101.29
Total CHOL/HDL Ratio: 3
Triglycerides: 78 mg/dL (ref 0.0–149.0)
VLDL: 15.6 mg/dL (ref 0.0–40.0)

## 2019-07-08 LAB — HEMOGLOBIN A1C: Hgb A1c MFr Bld: 5.7 % (ref 4.6–6.5)

## 2019-07-08 LAB — COMPREHENSIVE METABOLIC PANEL
ALT: 17 U/L (ref 0–35)
AST: 16 U/L (ref 0–37)
Albumin: 4 g/dL (ref 3.5–5.2)
Alkaline Phosphatase: 113 U/L (ref 39–117)
BUN: 12 mg/dL (ref 6–23)
CO2: 31 mEq/L (ref 19–32)
Calcium: 9.1 mg/dL (ref 8.4–10.5)
Chloride: 106 mEq/L (ref 96–112)
Creatinine, Ser: 0.89 mg/dL (ref 0.40–1.20)
GFR: 77.12 mL/min (ref >60.00)
Glucose, Bld: 98 mg/dL (ref 70–99)
Potassium: 4.2 mEq/L (ref 3.5–5.1)
Sodium: 141 mEq/L (ref 135–145)
Total Bilirubin: 0.4 mg/dL (ref 0.2–1.2)
Total Protein: 7 g/dL (ref 6.0–8.3)

## 2019-07-08 LAB — CBC
HCT: 38.4 % (ref 36.0–46.0)
Hemoglobin: 12.3 g/dL (ref 12.0–15.0)
MCHC: 32.1 g/dL (ref 30.0–36.0)
MCV: 87.5 fl (ref 78.0–100.0)
Platelets: 237 10*3/uL (ref 150.0–400.0)
RBC: 4.39 Mil/uL (ref 3.87–5.11)
RDW: 14.6 % (ref 11.5–15.5)
WBC: 3.8 10*3/uL — ABNORMAL LOW (ref 4.0–10.5)

## 2019-07-08 LAB — VITAMIN B12: Vitamin B-12: 284 pg/mL (ref 211–911)

## 2019-07-08 LAB — TSH: TSH: 1.7 u[IU]/mL (ref 0.35–4.50)

## 2019-07-08 LAB — VITAMIN D 25 HYDROXY (VIT D DEFICIENCY, FRACTURES): VITD: 16.25 ng/mL — ABNORMAL LOW (ref 30.00–100.00)

## 2019-07-08 MED ORDER — VITAMIN D (ERGOCALCIFEROL) 1.25 MG (50000 UNIT) PO CAPS: 50000.0000 [IU] | ORAL_CAPSULE | ORAL | 0 refills | Status: DC

## 2019-07-08 NOTE — Progress Notes (Signed)
   Subjective:   Patient ID: Casey Newton, female    DOB: September 08, 1955, 64 y.o.   MRN: [id number]  HPI The patient is a 64 YO female coming in for physical but with concerns  HPI #2: Concerns about dizziness (episodes off and on in the past, more recently, not predictable dizziness, denies heart palpitations or chest pains, denies syncope, denies numbness or weakness during episodes, denies room spinning during episodes).   Review of Systems  Constitutional: Negative.   HENT: Negative.   Eyes: Negative.   Respiratory: Negative for cough, chest tightness and shortness of breath.   Cardiovascular: Negative for chest pain, palpitations and leg swelling.  Gastrointestinal: Negative for abdominal distention, abdominal pain, constipation, diarrhea, nausea and vomiting.  Musculoskeletal: Negative.   Skin: Negative.   Neurological: Positive for dizziness and light-headedness.  Psychiatric/Behavioral: Negative.     Objective:  Physical Exam Constitutional:      Appearance: She is well-developed.  HENT:     Head: Normocephalic and atraumatic.  Cardiovascular:     Rate and Rhythm: Regular rhythm. Bradycardia present.  Pulmonary:     Effort: Pulmonary effort is normal. No respiratory distress.     Breath sounds: Normal breath sounds. No wheezing or rales.  Abdominal:     General: Bowel sounds are normal. There is no distension.     Palpations: Abdomen is soft.     Tenderness: There is no abdominal tenderness. There is no rebound.  Musculoskeletal:     Cervical back: Normal range of motion.  Skin:    General: Skin is warm and dry.  Neurological:     Mental Status: She is alert and oriented to person, place, and time.     Coordination: Coordination normal.     Vitals:   07/08/19 0835  BP: 138/82  Pulse: (!) 52  Temp: 98.3 F (36.8 C)  SpO2: 99%  Weight: 217 lb 9.6 oz (98.7 kg)  Height: 5\' 1"  (1.549 m)   EKG: Rate 51, axis normal, interval normal, sinus brady, no st or t  wave changes, LVH, no significant change from 2011  This visit occurred during the SARS-CoV-2 public health emergency.  Safety protocols were in place, including screening questions prior to the visit, additional usage of staff PPE, and extensive cleaning of exam room while observing appropriate contact time as indicated for disinfecting solutions.   Assessment & Plan:

## 2019-07-08 NOTE — Patient Instructions (Addendum)
We have done the EKG today which looks normal.  We will check the labs and get you in with the nutritionist.   It is okay to get the covid-19 vaccine.    Health Maintenance, Female Adopting a healthy lifestyle and getting preventive care are important in promoting health and wellness. Ask your health care provider about:  The right schedule for you to have regular tests and exams.  Things you can do on your own to prevent diseases and keep yourself healthy. What should I know about diet, weight, and exercise? Eat a healthy diet   Eat a diet that includes plenty of vegetables, fruits, low-fat dairy products, and lean protein.  Do not eat a lot of foods that are high in solid fats, added sugars, or sodium. Maintain a healthy weight Body mass index (BMI) is used to identify weight problems. It estimates body fat based on height and weight. Your health care provider can help determine your BMI and help you achieve or maintain a healthy weight. Get regular exercise Get regular exercise. This is one of the most important things you can do for your health. Most adults should:  Exercise for at least 150 minutes each week. The exercise should increase your heart rate and make you sweat (moderate-intensity exercise).  Do strengthening exercises at least twice a week. This is in addition to the moderate-intensity exercise.  Spend less time sitting. Even light physical activity can be beneficial. Watch cholesterol and blood lipids Have your blood tested for lipids and cholesterol at 65 years of age, then have this test every 5 years. Have your cholesterol levels checked more often if:  Your lipid or cholesterol levels are high.  You are older than 65 years of age.  You are at high risk for heart disease. What should I know about cancer screening? Depending on your health history and family history, you may need to have cancer screening at various ages. This may include screening  for:  Breast cancer.  Cervical cancer.  Colorectal cancer.  Skin cancer.  Lung cancer. What should I know about heart disease, diabetes, and high blood pressure? Blood pressure and heart disease  High blood pressure causes heart disease and increases the risk of stroke. This is more likely to develop in people who have high blood pressure readings, are of African descent, or are overweight.  Have your blood pressure checked: ? Every 3-5 years if you are 15-72 years of age. ? Every year if you are 31 years old or older. Diabetes Have regular diabetes screenings. This checks your fasting blood sugar level. Have the screening done:  Once every three years after age 49 if you are at a normal weight and have a low risk for diabetes.  More often and at a younger age if you are overweight or have a high risk for diabetes. What should I know about preventing infection? Hepatitis B If you have a higher risk for hepatitis B, you should be screened for this virus. Talk with your health care provider to find out if you are at risk for hepatitis B infection. Hepatitis C Testing is recommended for:  Everyone born from [birth year] through 1966.  Anyone with known risk factors for hepatitis C. Sexually transmitted infections (STIs)  Get screened for STIs, including gonorrhea and chlamydia, if: ? You are sexually active and are younger than 65 years of age. ? You are older than 65 years of age and your health care provider tells you that you  are at risk for this type of infection. ? Your sexual activity has changed since you were last screened, and you are at increased risk for chlamydia or gonorrhea. Ask your health care provider if you are at risk.  Ask your health care provider about whether you are at high risk for HIV. Your health care provider may recommend a prescription medicine to help prevent HIV infection. If you choose to take medicine to prevent HIV, you should first get tested for HIV.  You should then be tested every 3 months for as long as you are taking the medicine. Pregnancy  If you are about to stop having your period (premenopausal) and you may become pregnant, seek counseling before you get pregnant.  Take 400 to 800 micrograms (mcg) of folic acid every day if you become pregnant.  Ask for birth control (contraception) if you want to prevent pregnancy. Osteoporosis and menopause Osteoporosis is a disease in which the bones lose minerals and strength with aging. This can result in bone fractures. If you are 34 years old or older, or if you are at risk for osteoporosis and fractures, ask your health care provider if you should:  Be screened for bone loss.  Take a calcium or vitamin D supplement to lower your risk of fractures.  Be given hormone replacement therapy (HRT) to treat symptoms of menopause. Follow these instructions at home: Lifestyle  Do not use any products that contain nicotine or tobacco, such as cigarettes, e-cigarettes, and chewing tobacco. If you need help quitting, ask your health care provider.  Do not use street drugs.  Do not share needles.  Ask your health care provider for help if you need support or information about quitting drugs. Alcohol use  Do not drink alcohol if: ? Your health care provider tells you not to drink. ? You are pregnant, may be pregnant, or are planning to become pregnant.  If you drink alcohol: ? Limit how much you use to 0-1 drink a day. ? Limit intake if you are breastfeeding.  Be aware of how much alcohol is in your drink. In the [place] one drink equals one 12 oz bottle of beer (355 mL), one 5 oz glass of wine (148 mL), or one 1 oz glass of hard liquor (44 mL). General instructions  Schedule regular health, dental, and eye exams.  Stay current with your vaccines.  Tell your health care provider if: ? You often feel depressed. ? You have ever been abused or do not feel safe at  home. Summary  Adopting a healthy lifestyle and getting preventive care are important in promoting health and wellness.  Follow your health care provider's instructions about healthy diet, exercising, and getting tested or screened for diseases.  Follow your health care provider's instructions on monitoring your cholesterol and blood pressure. This information is not intended to replace advice given to you by your health care provider. Make sure you discuss any questions you have with your health care provider. Document Revised: 02/10/2018 Document Reviewed: 02/10/2018 Elsevier Patient Education  2020 [employer].

## 2019-07-08 NOTE — Assessment & Plan Note (Signed)
Flu shot due next season. Covid-19 encouraged to do. Shingrix counseled. Tetanus up to date. Colonoscopy declines. Mammogram up to date, pap smear not indicated. Counseled about sun safety and mole surveillance. Counseled about the dangers of distracted driving. Given 10 year screening recommendations.

## 2019-07-08 NOTE — Telephone Encounter (Signed)
Enrolled patient for a 14 day Zio monitor to be mailed to patients home. Brief instructions were gone over with the patients and she knows to expect the monitor to arrive in 3-4 days.  

## 2019-07-08 NOTE — Assessment & Plan Note (Signed)
BP at goal on telmisartan/hctz and checking CMP. Adjust as needed.  

## 2019-07-08 NOTE — Assessment & Plan Note (Signed)
Weight up with pandemic and her husband is now on dialysis and they are eating take out food more often and she is interested in getting some healthy and easy to make options from nutrition. Referral placed today.

## 2019-07-08 NOTE — Assessment & Plan Note (Signed)
Concerning given bradycardia on EKG and in past. Concern for low HR during episodes of dizziness/lightheadedness. Checking CBC and CMP and vitamin levels and thyroid to exclude other causes as well. Advised plenty of hydration. She is not on any rate blockers so if significant bradycardia will need referral to cardiology.

## 2019-07-20 DIAGNOSIS — G4733 Obstructive sleep apnea (adult) (pediatric): Secondary | ICD-10-CM | POA: Diagnosis not present

## 2019-07-28 ENCOUNTER — Other Ambulatory Visit (INDEPENDENT_AMBULATORY_CARE_PROVIDER_SITE_OTHER): Payer: BC Managed Care – PPO

## 2019-07-28 DIAGNOSIS — R42 Dizziness and giddiness: Secondary | ICD-10-CM

## 2019-08-09 ENCOUNTER — Ambulatory Visit: Payer: BC Managed Care – PPO | Admitting: Internal Medicine

## 2019-08-09 ENCOUNTER — Other Ambulatory Visit: Payer: Self-pay

## 2019-08-09 ENCOUNTER — Encounter: Payer: Self-pay | Admitting: Internal Medicine

## 2019-08-09 DIAGNOSIS — M7918 Myalgia, other site: Secondary | ICD-10-CM

## 2019-08-09 NOTE — Patient Instructions (Signed)
I would recommend to take advil for the pain to help this get better quicker.   Use ice 3 times a day for the next 1-2 days then you can use either heat or ice.

## 2019-08-09 NOTE — Progress Notes (Signed)
   Subjective:   Patient ID: Casey Newton, female    DOB: 21-Sep-1955, 64 y.o.   MRN: [id number]  HPI The patient is a 64 YO female coming in for concerns about right hip pain. Started this morning when she tripped over her husband's leg. She was able to catch herself from falling to floor against the wall. She did hear a popping in the posterior buttock region. Denies hip leaving socket or instability since. Denies pain with walking. Used ice on the area afterwards. Did not take anything. Wanted to make sure hip was okay.  Review of Systems  Constitutional: Negative.   HENT: Negative.   Eyes: Negative.   Respiratory: Negative for cough, chest tightness and shortness of breath.   Cardiovascular: Negative for chest pain, palpitations and leg swelling.  Gastrointestinal: Negative for abdominal distention, abdominal pain, constipation, diarrhea, nausea and vomiting.  Musculoskeletal: Positive for myalgias.  Skin: Negative.   Neurological: Negative.   Psychiatric/Behavioral: Negative.     Objective:  Physical Exam Constitutional:      Appearance: She is well-developed.  HENT:     Head: Normocephalic and atraumatic.  Cardiovascular:     Rate and Rhythm: Normal rate and regular rhythm.  Pulmonary:     Effort: Pulmonary effort is normal. No respiratory distress.     Breath sounds: Normal breath sounds. No wheezing or rales.  Abdominal:     General: Bowel sounds are normal. There is no distension.     Palpations: Abdomen is soft.     Tenderness: There is no abdominal tenderness. There is no rebound.  Musculoskeletal:        General: Tenderness present.     Cervical back: Normal range of motion.     Comments: Pain at the insertion of gluteus maximum muscle right, no pain in the groin or lateral thigh to palpation. Right hip not displaced and is in socket appropriately without instability.   Skin:    General: Skin is warm and dry.  Neurological:     Mental Status: She is alert  and oriented to person, place, and time.     Coordination: Coordination normal.     Vitals:   08/09/19 1557  BP: 136/88  Pulse: 62  Temp: 98.9 F (37.2 C)  TempSrc: Oral  SpO2: 96%  Height: 5\' 1"  (1.549 m)    This visit occurred during the SARS-CoV-2 public health emergency.  Safety protocols were in place, including screening questions prior to the visit, additional usage of staff PPE, and extensive cleaning of exam room while observing appropriate contact time as indicated for disinfecting solutions.   Assessment & Plan:

## 2019-08-10 DIAGNOSIS — M7918 Myalgia, other site: Secondary | ICD-10-CM | POA: Insufficient documentation

## 2019-08-10 NOTE — Assessment & Plan Note (Signed)
Suspect strain gluteus muscle. Advised RICE and NSAIDs for pain. No imaging required at today's visit. Given return precautions.

## 2019-08-11 ENCOUNTER — Ambulatory Visit: Payer: BC Managed Care – PPO | Admitting: Skilled Nursing Facility1

## 2019-08-14 ENCOUNTER — Other Ambulatory Visit: Payer: Self-pay | Admitting: Internal Medicine

## 2019-08-29 ENCOUNTER — Encounter (INDEPENDENT_AMBULATORY_CARE_PROVIDER_SITE_OTHER): Payer: Self-pay

## 2019-09-30 ENCOUNTER — Other Ambulatory Visit: Payer: Self-pay | Admitting: Internal Medicine

## 2019-10-15 ENCOUNTER — Encounter: Payer: Self-pay | Admitting: Internal Medicine

## 2019-12-16 ENCOUNTER — Other Ambulatory Visit: Payer: Self-pay | Admitting: Family Medicine

## 2019-12-16 DIAGNOSIS — Z13820 Encounter for screening for osteoporosis: Secondary | ICD-10-CM

## 2019-12-16 DIAGNOSIS — Z780 Asymptomatic menopausal state: Secondary | ICD-10-CM

## 2020-02-22 ENCOUNTER — Ambulatory Visit
Admission: RE | Admit: 2020-02-22 | Discharge: 2020-02-22 | Disposition: A | Discharge status: Home / Self Care | Payer: Medicare Other | Source: Ambulatory Visit | Attending: Family Medicine | Admitting: Family Medicine

## 2020-02-22 ENCOUNTER — Other Ambulatory Visit: Payer: Self-pay | Admitting: Family Medicine

## 2020-02-22 DIAGNOSIS — M545 Low back pain, unspecified: Secondary | ICD-10-CM

## 2020-05-07 ENCOUNTER — Other Ambulatory Visit: Payer: Self-pay | Admitting: Family Medicine

## 2020-05-07 DIAGNOSIS — Z1231 Encounter for screening mammogram for malignant neoplasm of breast: Secondary | ICD-10-CM

## 2020-05-11 ENCOUNTER — Other Ambulatory Visit: Payer: BC Managed Care – PPO

## 2020-10-19 ENCOUNTER — Ambulatory Visit
Admission: RE | Admit: 2020-10-19 | Discharge: 2020-10-19 | Disposition: A | Discharge status: Home / Self Care | Payer: Medicare Other | Source: Ambulatory Visit | Attending: Family Medicine | Admitting: Family Medicine

## 2020-10-19 ENCOUNTER — Other Ambulatory Visit: Payer: Self-pay

## 2020-10-19 DIAGNOSIS — Z13820 Encounter for screening for osteoporosis: Secondary | ICD-10-CM

## 2020-10-19 DIAGNOSIS — Z780 Asymptomatic menopausal state: Secondary | ICD-10-CM

## 2020-10-19 DIAGNOSIS — Z1231 Encounter for screening mammogram for malignant neoplasm of breast: Secondary | ICD-10-CM

## 2021-02-12 ENCOUNTER — Ambulatory Visit: Payer: Medicare Other | Attending: Family Medicine | Admitting: Physical Therapy

## 2021-02-12 ENCOUNTER — Other Ambulatory Visit: Payer: Self-pay

## 2021-02-12 DIAGNOSIS — H8111 Benign paroxysmal vertigo, right ear: Secondary | ICD-10-CM | POA: Diagnosis present

## 2021-02-12 DIAGNOSIS — R42 Dizziness and giddiness: Secondary | ICD-10-CM | POA: Insufficient documentation

## 2021-02-13 NOTE — Therapy (Signed)
[facility] [address] [place], [place], [address] Phone: [phone]   Fax:  [phone]  Physical Therapy Evaluation  Patient Details  Name: Casey Newton MRN: [id number] Date of Birth: Jul 06, 1955 Referring Provider (PT): Margot Ables, MD   Encounter Date: 02/12/2021   PT End of Session - 02/13/21 1143     Visit Number 1    Number of Visits 5    Date for PT Re-Evaluation 03/15/21    Authorization Type UHC Medicare    Progress Note Due on Visit 10    PT Start Time 0200    PT Stop Time 0245    PT Time Calculation (min) 45 min    Activity Tolerance Patient tolerated treatment well    Behavior During Therapy [facility] for tasks assessed/performed             Past Medical History:  Diagnosis Date   Arthritis    left hip   Hypertension    OSA on CPAP     Past Surgical History:  Procedure Laterality Date   ABDOMINAL HYSTERECTOMY     left ovaries   BUNIONECTOMY     left foot   CESAREAN SECTION     2 times   CHOLECYSTECTOMY N/A 11/12/2014   Procedure: LAPAROSCOPIC CHOLECYSTECTOMY;  Surgeon: Jimmye Norman, MD;  Location: [facility] OR;  Service: General;  Laterality: N/A;   Dental Implants     TONSILLECTOMY      There were no vitals filed for this visit.    Subjective Assessment - 02/12/21 1407     Subjective "This isn't the first time this has happened."  First time pt was in [place], when standing up she became very dizzy.  Only happened one time and then it happened again in 2020 when coming up to standing.  Then it returned 3 weeks ago - this episode has been more frequent and longer duration.  Stress level has increased due to husband's health issues and taking care of elderly aunt.  Pt was getting PT for hip and when getting up off the mat she would feel dizzy.   Does experience dizziness when up moving around; denies symptoms when just sitting; denies symptoms from sitting > supine; no symptoms  when rolling.  Most symptoms are from supine > standing.    Pertinent History OSA, HTN, Obesity, h/o dizziness, was in PT for L hip pain    Limitations Standing;Walking    Diagnostic tests None    Patient Stated Goals Show me how to make the dizziness go away    Currently in Pain? Yes   achy in hip               [facility] PT Assessment - 02/12/21 1417       Assessment   Medical Diagnosis BPPV    Referring Provider (PT) Margot Ables, MD    Onset Date/Surgical Date 02/07/21    Prior Therapy not for dizziness      Precautions   Precautions Other (comment)    Precaution Comments OSA, HTN, Obesity, h/o dizziness, was in PT for L hip pain      Balance Screen   Has the patient fallen in the past 6 months No      Home Environment   Living Environment Private residence    Living Arrangements Spouse/significant other;Children    Additional Comments No issues with driving or ADL's; no difficulty with household tasks      Prior Function  Level of Independence Independent    Leisure Water aerobics class with her husband      Observation/Other Assessments   Focus on Therapeutic Outcomes (FOTO)  DFS: 67 (WNL); DPS: 58.9      Sensation   Light Touch Appears Intact      ROM / Strength   AROM / PROM / Strength Strength      Strength   Overall Strength Within functional limits for tasks performed      Ambulation/Gait   Ambulation/Gait Yes    Ambulation/Gait Assistance 7: Independent    Ambulation Distance (Feet) 115 Feet    Assistive device None    Gait Pattern Antalgic    Ambulation Surface Level;Indoor                    Vestibular Assessment - 02/12/21 1419       Symptom Behavior   Subjective history of current problem Denies nausea, vomiting, or changes in vision.  Feels like there is water in the L ear.  Reports mild tinnitus in L ear.  Denies HA or issues with neck.    Type of Dizziness  Lightheadedness;"Funny feeling in head"    Frequency of  Dizziness couple times a week    Duration of Dizziness 1-2 minutes    Symptom Nature Motion provoked;Positional    Aggravating Factors Supine to sit;Sit to stand    Relieving Factors Closing eyes    Progression of Symptoms Better    History of similar episodes Pt reports not using water reservoir on CPAP and symptoms have improved; without water reservoir it dries out sinuses.      Oculomotor Exam   Oculomotor Alignment Normal    Spontaneous Absent    Gaze-induced  Absent    Smooth Pursuits Intact    Saccades Intact      Oculomotor Exam-Fixation Suppressed    Left Head Impulse negative    Right Head Impulse negative      Vestibulo-Ocular Reflex   VOR to Slow Head Movement Comment   dizziness with slow VOR vertical   VOR Cancellation Corrective saccades      Positional Testing   Dix-Hallpike Dix-Hallpike Right;Dix-Hallpike Left    Horizontal Canal Testing Horizontal Canal Right;Horizontal Canal Left      Dix-Hallpike Right   Dix-Hallpike Right Duration 10 seconds    Dix-Hallpike Right Symptoms Upbeat, right rotatory nystagmus      Dix-Hallpike Left   Dix-Hallpike Left Duration 0    Dix-Hallpike Left Symptoms No nystagmus                Objective measurements completed on examination: See above findings.        Vestibular Treatment/Exercise - 02/12/21 1435       Vestibular Treatment/Exercise   Vestibular Treatment Provided Canalith Repositioning    Canalith Repositioning Epley Manuever Right       EPLEY MANUEVER RIGHT   Number of Reps  1    Overall Response Symptoms Resolved                    PT Education - 02/13/21 1143     Education Details clinical findings, BPPV, PT POC and goals    Person(s) Educated Patient    Methods Explanation    Comprehension Verbalized understanding                 PT Long Term Goals - 02/13/21 1150       PT LONG TERM GOAL #1  Title Pt will report improvement on FOTO: DFS to >/= 70 and DPS to >/=  63    Time 4    Period Weeks    Status New    Target Date 03/15/21      PT LONG TERM GOAL #2   Title Pt will demonstrate full resolution of BPPV and will report no symptoms of dizziness when transitioning from supine > standing    Time 4    Period Weeks    Status New    Target Date 03/15/21      PT LONG TERM GOAL #3   Title Pt will demonstrate ability to perform self - CRM with pillow safely and effectively to manage future episodes of BPPV.    Time 4    Period Weeks    Status New    Target Date 03/15/21                    Plan - 02/13/21 1144     Clinical Impression Statement Pt is a 65 year old female referred to [facility] for assessment and treatment of BPPV.  Pt demonstrated the following impairments: vertigo and upbeating, R torsional nystagmus of short duration indicating R posterior canal BPPV treated with 1 CRM with full resolution of symptoms.  Pt will benefit from follow up visits to assess for reoccurrence of BPPV and for education on how to perform self-CRM at home safely and effectively to manage future episodes of BPPV.    Personal Factors and Comorbidities Comorbidity 3+;Past/Current Experience    Comorbidities OSA, HTN, Obesity, h/o dizziness, was in PT for L hip pain    Examination-Activity Limitations Bed Mobility;Stand;Transfers    Stability/Clinical Decision Making Stable/Uncomplicated    Clinical Decision Making Low    Rehab Potential Good    PT Frequency 1x / week    PT Duration 4 weeks    PT Treatment/Interventions ADLs/Self Care Home Management;Canalith Repostioning;Functional mobility training;Therapeutic activities;Therapeutic exercise;Balance training;Neuromuscular re-education;Patient/family education;Vestibular    PT Next Visit Plan re-check for R BPPV, check other canals and treat if indicated.  Teach home CRM with pillow.    Consulted and Agree with Plan of Care Patient             Patient will benefit from skilled therapeutic  intervention in order to improve the following deficits and impairments:  Dizziness  Visit Diagnosis: BPPV (benign paroxysmal positional vertigo), right  Dizziness and giddiness     Problem List Patient Active Problem List   Diagnosis Date Noted   Right buttock pain 08/10/2019   Dizziness 09/01/2018   Routine general medical examination at a health care facility 09/13/2015   Morbid obesity (HCC) 09/13/2014   Essential hypertension 09/13/2014   OSA (obstructive sleep apnea) 09/13/2014    Dierdre Highman, PT, [name] 02/13/21    11:53 AM    [facility] [address] [place], [place], [address] Phone: [phone]   Fax:  [phone]  Name: Casey Newton MRN: [id number] Date of Birth: [birth year]

## 2021-02-19 ENCOUNTER — Ambulatory Visit: Payer: Medicare Other

## 2021-02-19 ENCOUNTER — Other Ambulatory Visit: Payer: Self-pay

## 2021-02-19 DIAGNOSIS — H8111 Benign paroxysmal vertigo, right ear: Secondary | ICD-10-CM

## 2021-02-19 DIAGNOSIS — R42 Dizziness and giddiness: Secondary | ICD-10-CM

## 2021-02-19 NOTE — Therapy (Signed)
[facility] [address] [place], [place], [address] Phone: [phone]   Fax:  [phone]  Physical Therapy Treatment  Patient Details  Name: Casey Newton MRN: [id number] Date of Birth: Sep 02, 1955 Referring Provider (PT): Margot Ables, MD   Encounter Date: 02/19/2021   PT End of Session - 02/19/21 1019     Visit Number 2    Number of Visits 5    Date for PT Re-Evaluation 03/15/21    Authorization Type UHC Medicare    Progress Note Due on Visit 10    PT Start Time 1017    PT Stop Time 1037    PT Time Calculation (min) 20 min    Activity Tolerance Patient tolerated treatment well    Behavior During Therapy WFL for tasks assessed/performed             Past Medical History:  Diagnosis Date   Arthritis    left hip   Hypertension    OSA on CPAP     Past Surgical History:  Procedure Laterality Date   ABDOMINAL HYSTERECTOMY     left ovaries   BUNIONECTOMY     left foot   CESAREAN SECTION     2 times   CHOLECYSTECTOMY N/A 11/12/2014   Procedure: LAPAROSCOPIC CHOLECYSTECTOMY;  Surgeon: Jimmye Norman, MD;  Location: [facility] OR;  Service: General;  Laterality: N/A;   Dental Implants     TONSILLECTOMY      There were no vitals filed for this visit.   Subjective Assessment - 02/19/21 1019     Subjective Patient reports that had a single episode of dizziness over the weekend, when she was getting up. No other new changes/complaints.    Pertinent History OSA, HTN, Obesity, h/o dizziness, was in PT for L hip pain    Limitations Standing;Walking    Diagnostic tests None    Patient Stated Goals Show me how to make the dizziness go away    Currently in Pain? No/denies                 Vestibular Assessment - 02/19/21 0001       Positional Testing   Dix-Hallpike Dix-Hallpike Right;Dix-Hallpike Left      Dix-Hallpike Right   Dix-Hallpike Right Duration 5 seconds    Dix-Hallpike Right  Symptoms Upbeat, right rotatory nystagmus      Dix-Hallpike Left   Dix-Hallpike Left Duration 0    Dix-Hallpike Left Symptoms No nystagmus               [facility] Adult PT Treatment/Exercise - 02/19/21 0001       Therapeutic Activites    Therapeutic Activities Other Therapeutic Activities    Other Therapeutic Activities PT educated on home CRM with pillow, reviewed and demonstrated for patient. Handout provided.             Vestibular Treatment/Exercise - 02/19/21 0001       Vestibular Treatment/Exercise   Vestibular Treatment Provided Canalith Repositioning    Canalith Repositioning Epley Manuever Right       EPLEY MANUEVER RIGHT   Number of Reps  1    Overall Response Symptoms Resolved                   PT Education - 02/19/21 1049     Education Details Home CRM w/ pillow    Person(s) Educated Patient    Methods Explanation;Demonstration;Handout    Comprehension Verbalized understanding;Returned demonstration  PT Long Term Goals - 02/13/21 1150       PT LONG TERM GOAL #1   Title Pt will report improvement on FOTO: DFS to >/= 70 and DPS to >/= 63    Time 4    Period Weeks    Status New    Target Date 03/15/21      PT LONG TERM GOAL #2   Title Pt will demonstrate full resolution of BPPV and will report no symptoms of dizziness when transitioning from supine > standing    Time 4    Period Weeks    Status New    Target Date 03/15/21      PT LONG TERM GOAL #3   Title Pt will demonstrate ability to perform self - CRM with pillow safely and effectively to manage future episodes of BPPV.    Time 4    Period Weeks    Status New    Target Date 03/15/21                   Plan - 02/19/21 1046     Clinical Impression Statement Patient continues to present with upbeating, R torsional nystagmus of short duration indicating R posterior canal BPPV. Continued CRM x 1 reps with resolution noted. Due to reoccurence, PT  educating on home CRM manuever with pillow, patient verbalized understanding. Will continue to progress toward all LTGs.    Personal Factors and Comorbidities Comorbidity 3+;Past/Current Experience    Comorbidities OSA, HTN, Obesity, h/o dizziness, was in PT for L hip pain    Examination-Activity Limitations Bed Mobility;Stand;Transfers    Stability/Clinical Decision Making Stable/Uncomplicated    Rehab Potential Good    PT Frequency 1x / week    PT Duration 4 weeks    PT Treatment/Interventions ADLs/Self Care Home Management;Canalith Repostioning;Functional mobility training;Therapeutic activities;Therapeutic exercise;Balance training;Neuromuscular re-education;Patient/family education;Vestibular    PT Next Visit Plan re-check for R BPPV. Teach home CRM with pillow.    Consulted and Agree with Plan of Care Patient             Patient will benefit from skilled therapeutic intervention in order to improve the following deficits and impairments:  Dizziness  Visit Diagnosis: BPPV (benign paroxysmal positional vertigo), right  Dizziness and giddiness     Problem List Patient Active Problem List   Diagnosis Date Noted   Right buttock pain 08/10/2019   Dizziness 09/01/2018   Routine general medical examination at a health care facility 09/13/2015   Morbid obesity (HCC) 09/13/2014   Essential hypertension 09/13/2014   OSA (obstructive sleep apnea) 09/13/2014    Casey Newton, PT, [name] 02/19/2021, 10:50 AM  [facility] [address] [place], [place], [address] Phone: [phone]   Fax:  [phone]  Name: Casey Newton MRN: [id number] Date of Birth: 1955-06-07

## 2021-03-08 ENCOUNTER — Ambulatory Visit: Payer: Medicare Other | Attending: Internal Medicine

## 2021-03-08 ENCOUNTER — Other Ambulatory Visit: Payer: Self-pay

## 2021-03-08 DIAGNOSIS — H8111 Benign paroxysmal vertigo, right ear: Secondary | ICD-10-CM | POA: Insufficient documentation

## 2021-03-08 DIAGNOSIS — R42 Dizziness and giddiness: Secondary | ICD-10-CM | POA: Diagnosis present

## 2021-03-08 NOTE — Therapy (Signed)
[facility] [address] [place], [place], [address] Phone: [phone]   Fax:  [phone]  Physical Therapy Treatment  Patient Details  Name: Casey Newton MRN: [id number] Date of Birth: 12-24-1955 Referring Provider (PT): Buzzy Han, MD   Encounter Date: 03/08/2021   PT End of Session - 03/08/21 1011     Visit Number 3    Number of Visits 5    Date for PT Re-Evaluation 03/15/21    Authorization Type UHC Medicare    Progress Note Due on Visit 10    PT Start Time 1012    PT Stop Time 1025    PT Time Calculation (min) 13 min    Activity Tolerance Patient tolerated treatment well    Behavior During Therapy WFL for tasks assessed/performed             Past Medical History:  Diagnosis Date   Arthritis    left hip   Hypertension    OSA on CPAP     Past Surgical History:  Procedure Laterality Date   ABDOMINAL HYSTERECTOMY     left ovaries   BUNIONECTOMY     left foot   CESAREAN SECTION     2 times   CHOLECYSTECTOMY N/A 11/12/2014   Procedure: LAPAROSCOPIC CHOLECYSTECTOMY;  Surgeon: Judeth Horn, MD;  Location: [facility];  Service: General;  Laterality: N/A;   Dental Implants     TONSILLECTOMY      There were no vitals filed for this visit.   Subjective Assessment - 03/08/21 1013     Subjective Patient reports that she has only had to do the manuever once. No episodes recently. No other new changes.    Pertinent History OSA, HTN, Obesity, h/o dizziness, was in PT for L hip pain    Limitations Standing;Walking    Diagnostic tests None    Patient Stated Goals Show me how to make the dizziness go away    Currently in Pain? No/denies                     Vestibular Assessment - 03/08/21 0001       Positional Testing   Dix-Hallpike Dix-Hallpike Right;Dix-Hallpike Left      Dix-Hallpike Right   Dix-Hallpike Right Duration 0    Dix-Hallpike Right Symptoms No nystagmus       Dix-Hallpike Left   Dix-Hallpike Left Duration 0    Dix-Hallpike Left Symptoms No nystagmus                  [facility] Adult PT Treatment/Exercise - 03/08/21 0001       Therapeutic Activites    Therapeutic Activities Other Therapeutic Activities    Other Therapeutic Activities Reviewed self manuever, patient has no questions/concerns at this time, reports feels comfortable with self manuever for management at home as needed.                     PT Education - 03/08/21 1024     Education Details plan to keep last visit as scheduled in case of reoccurence; planned d/c if symptoms do not return.    Person(s) Educated Patient    Methods Explanation    Comprehension Verbalized understanding                 PT Long Term Goals - 03/08/21 1029       PT LONG TERM GOAL #1   Title Pt will report improvement on  FOTO: DFS to >/= 70 and DPS to >/= 63    Time 4    Period Weeks    Status New    Target Date 03/15/21      PT LONG TERM GOAL #2   Title Pt will demonstrate full resolution of BPPV and will report no symptoms of dizziness when transitioning from supine > standing    Baseline negative positional testing 03/08/21    Time 4    Period Weeks    Status Achieved    Target Date 03/15/21      PT LONG TERM GOAL #3   Title Pt will demonstrate ability to perform self - CRM with pillow safely and effectively to manage future episodes of BPPV.    Baseline verbalzied underestanding/independence    Time 4    Period Weeks    Status Achieved    Target Date 03/15/21                   Plan - 03/08/21 1026     Clinical Impression Statement Patient presents with PT with one episode of vertigo with self mangement at home with completing home CRM. Reassesed positional testing today with no nystagmus/vertigo. Reviewed home manuever with patient verbalizing understanding. Will plan to keep last scheduled visit in case of reoccurence due beginning water aerobics.  Will continue to progress toward all LTGs.    Personal Factors and Comorbidities Comorbidity 3+;Past/Current Experience    Comorbidities OSA, HTN, Obesity, h/o dizziness, was in PT for L hip pain    Examination-Activity Limitations Bed Mobility;Stand;Transfers    Stability/Clinical Decision Making Stable/Uncomplicated    Rehab Potential Good    PT Frequency 1x / week    PT Duration 4 weeks    PT Treatment/Interventions ADLs/Self Care Home Management;Canalith Repostioning;Functional mobility training;Therapeutic activities;Therapeutic exercise;Balance training;Neuromuscular re-education;Patient/family education;Vestibular    PT Next Visit Plan re-check for R BPPV. d/c if no symptoms.    Consulted and Agree with Plan of Care Patient             Patient will benefit from skilled therapeutic intervention in order to improve the following deficits and impairments:  Dizziness  Visit Diagnosis: BPPV (benign paroxysmal positional vertigo), right  Dizziness and giddiness     Problem List Patient Active Problem List   Diagnosis Date Noted   Right buttock pain 08/10/2019   Dizziness 09/01/2018   Routine general medical examination at a health care facility 09/13/2015   Morbid obesity ([place]) 09/13/2014   Essential hypertension 09/13/2014   OSA (obstructive sleep apnea) 09/13/2014    Jones Bales, PT, [name] 03/08/2021, 10:30 AM  [facility] [address] [place], [place], [address] Phone: [phone]   Fax:  [phone]  Name: Casey Newton MRN: [id number] Date of Birth: [birth year]

## 2021-03-13 ENCOUNTER — Encounter: Payer: Self-pay | Admitting: Physical Therapy

## 2021-03-13 NOTE — Therapy (Signed)
[facility] [address] [place], [place], [address] Phone: [phone]   Fax:  [phone]  Patient Details  Name: KERSTI SCAVONE MRN: [id number] Date of Birth: 1955/06/08 Referring Provider:  No ref. provider found  Encounter Date: 03/13/2021  PHYSICAL THERAPY DISCHARGE SUMMARY  Visits from Start of Care: 3  Current functional level related to goals / functional outcomes: Did not formally assess; pt did meet 2/3 LTG assessed at last therapy visit.  Did not formally re-assess FOTO.  Patient cancelled last visit due to resolution of symptoms.   Remaining deficits: None   Education / Equipment: HEP-self CRM   Patient agrees to discharge. Patient goals were partially met. Patient is being discharged due to being pleased with the current functional level.  Rico Junker, PT, [name] 03/13/21    4:41 PM   [facility] [address] [place], [place], [address] Phone: [phone]   Fax:  [phone]

## 2021-03-14 ENCOUNTER — Ambulatory Visit: Payer: Medicare Other | Admitting: Physical Therapy

## 2021-04-22 ENCOUNTER — Other Ambulatory Visit: Payer: Self-pay

## 2021-04-22 ENCOUNTER — Ambulatory Visit: Payer: Medicare Other | Admitting: Podiatry

## 2021-04-22 DIAGNOSIS — L600 Ingrowing nail: Secondary | ICD-10-CM

## 2021-04-22 DIAGNOSIS — M79674 Pain in right toe(s): Secondary | ICD-10-CM

## 2021-04-22 DIAGNOSIS — M79675 Pain in left toe(s): Secondary | ICD-10-CM

## 2021-04-22 DIAGNOSIS — B351 Tinea unguium: Secondary | ICD-10-CM | POA: Diagnosis not present

## 2021-04-22 NOTE — Patient Instructions (Addendum)
Look at using "fungi-nail" on the toenails. We can also consider a prescription called "jublia".   Ingrown Toenail An ingrown toenail occurs when the corner or sides of a toenail grow into the surrounding skin. This causes discomfort and pain. The big toe is most commonly affected, but any of the toes can be affected. If an ingrown toenail is not treated, it can become infected. What are the causes? This condition may be caused by: Wearing shoes that are too small or tight. An injury, such as stubbing your toe or having your toe stepped on. Improper cutting or care of your toenails. Having nail or foot abnormalities that were present from birth (congenital abnormalities), such as having a nail that is too big for your toe. What increases the risk? The following factors may make you more likely to develop ingrown toenails: Age. Nails tend to get thicker with age, so ingrown nails are more common among older people. Cutting your toenails incorrectly, such as cutting them very short or cutting them unevenly. An ingrown toenail is more likely to get infected if you have: Diabetes. Blood flow (circulation) problems. What are the signs or symptoms? Symptoms of an ingrown toenail may include: Pain, soreness, or tenderness. Redness. Swelling. Hardening of the skin that surrounds the toenail. Signs that an ingrown toenail may be infected include: Fluid or pus. Symptoms that get worse. How is this diagnosed? Ingrown toenails may be diagnosed based on: Your symptoms and medical history. A physical exam. Labs or tests. If you have fluid or blood coming from your toenail, a sample may be collected to test for the specific type of bacteria that is causing the infection. How is this treated? Treatment depends on the severity of your symptoms. You may be able to care for your toenail at home. If you have an infection, you may be prescribed antibiotic medicines. If you have fluid or pus draining  from your toenail, your health care provider may drain it. If you have trouble walking, you may be given crutches to use. If you have a severe or infected ingrown toenail, you may need a procedure to remove part or all of the nail. Follow these instructions at home: Foot care  Check your wound every day for signs of infection, or as often as told by your health care provider. Check for: More redness, swelling, or pain. More fluid or blood. Warmth. Pus or a bad smell. Do not pick at your toenail or try to remove it yourself. Soak your foot in warm, soapy water. Do this for 20 minutes, 3 times a day, or as often as told by your health care provider. This helps to keep your toe clean and your skin soft. Wear shoes that fit well and are not too tight. Your health care provider may recommend that you wear open-toed shoes while you heal. Trim your toenails regularly and carefully. Cut your toenails straight across to prevent injury to the skin at the corners of the toenail. Do not cut your nails in a curved shape. Keep your feet clean and dry to help prevent infection. General instructions Take over-the-counter and prescription medicines only as told by your health care provider. If you were prescribed an antibiotic, take it as told by your health care provider. Do not stop taking the antibiotic even if you start to feel better. If your health care provider told you to use crutches to help you move around, use them as instructed. Return to your normal activities as told  by your health care provider. Ask your health care provider what activities are safe for you. Keep all follow-up visits. This is important. Contact a health care provider if: You have more redness, swelling, pain, or other symptoms that do not improve with treatment. You have fluid, blood, or pus coming from your toenail. You have a red streak on your skin that starts at your foot and spreads up your leg. You have a  fever. Summary An ingrown toenail occurs when the corner or sides of a toenail grow into the surrounding skin. This causes discomfort and pain. The big toe is most commonly affected, but any of the toes can be affected. If an ingrown toenail is not treated, it can become infected. Fluid or pus draining from your toenail is a sign of infection. Your health care provider may need to drain it. You may be given antibiotics to treat the infection. Trimming your toenails regularly and properly can help you prevent an ingrown toenail. This information is not intended to replace advice given to you by your health care provider. Make sure you discuss any questions you have with your health care provider. Document Revised: 06/19/2020 Document Reviewed: 06/19/2020 Elsevier Patient Education  2022 [employer].

## 2021-04-27 NOTE — Progress Notes (Signed)
Subjective:   Patient ID: Casey Newton, female   DOB: 66 y.o.   MRN: [id number]   HPI 66 year old female presents the office today for concerns of ingrown toenail, left medial border.  She typically will go to the nail salon to get it cut out but is been causing discomfort.  This started about 2 weeks ago.  She describes soreness to the toe.  No drainage or pus.  The nails do not get thick and elongated she has difficulty trimming them herself.   Review of Systems  All other systems reviewed and are negative.  Past Medical History:  Diagnosis Date   Arthritis    left hip   Hypertension    OSA on CPAP     Past Surgical History:  Procedure Laterality Date   ABDOMINAL HYSTERECTOMY     left ovaries   BUNIONECTOMY     left foot   CESAREAN SECTION     2 times   CHOLECYSTECTOMY N/A 11/12/2014   Procedure: LAPAROSCOPIC CHOLECYSTECTOMY;  Surgeon: Jimmye Norman, MD;  Location: [facility] OR;  Service: General;  Laterality: N/A;   Dental Implants     TONSILLECTOMY       Current Outpatient Medications:    aspirin 81 MG tablet, Take 81 mg by mouth daily. (Patient not taking: Reported on 02/12/2021), Disp: , Rfl:    betamethasone valerate ointment (VALISONE) 0.1 %, Apply 1 application topically 2 (two) times daily., Disp: 30 g, Rfl: 0   Omega-3 Fatty Acids (FISH OIL) 1000 MG CAPS, Take 1,000 mg by mouth daily. , Disp: , Rfl:    PAPAYA PO, Take 1 tablet by mouth as needed (for heartburn). , Disp: , Rfl:    telmisartan-hydrochlorothiazide (MICARDIS HCT) 80-12.5 MG tablet, TAKE 1 TABLET BY MOUTH EVERY DAY, Disp: 90 tablet, Rfl: 1   triamcinolone cream (KENALOG) 0.1 %, Apply 1 application topically 2 (two) times daily., Disp: 100 g, Rfl: 3   Vitamin D, Ergocalciferol, (DRISDOL) 1.25 MG (50000 UNIT) CAPS capsule, Take 1 capsule (50,000 Units total) by mouth every 7 (seven) days., Disp: 12 capsule, Rfl: 0  Allergies  Allergen Reactions   Penicillins Nausea Only and Other (See Comments)    Upset  stomach          Objective:  Physical Exam  General: AAO x3, NAD  Dermatological: Incurvation present to the left medial nail border.  Mild discomfort at the distal portion of toe nail.  There is no drainage or pus.  Nails are hypertrophic, dystrophic, brittle, discolored, elongated 10. No surrounding redness or drainage. Tenderness nails 1-5 bilaterally. No open lesions or pre-ulcerative lesions are identified today.  Vascular: Dorsalis Pedis artery and Posterior Tibial artery pedal pulses are palpable bilateral with immedate capillary fill time. There is no pain with calf compression, swelling, warmth, erythema.   Neruologic: Grossly intact via light touch bilateral.   Musculoskeletal: Aside from the toenails no other areas of discomfort.  Gait: Unassisted, Nonantalgic.       Assessment:   66 year old female with symptomatic ingrown toenail, onychomycosis     Plan:  -Treatment options discussed including all alternatives, risks, and complications -Etiology of symptoms were discussed -Discussed partial nail avulsion.  I did sharply debride the symptomatic nail with any complications or bleeding.  If symptoms continue need to have a partial nail avulsion.  I debrided nails x10 without any complications or bleeding.  We discussed different treatment options for nail fungus.  She is concerned with over-the-counter medicine discussed Fungi-Nail.  If  needed can prescribe Jublia or other medications.  Return in about 9 weeks (around 06/24/2021).  Vivi Barrack DPM

## 2021-08-02 ENCOUNTER — Ambulatory Visit: Payer: Medicare Other | Admitting: Podiatry

## 2021-08-02 DIAGNOSIS — M79675 Pain in left toe(s): Secondary | ICD-10-CM

## 2021-08-02 DIAGNOSIS — M79674 Pain in right toe(s): Secondary | ICD-10-CM | POA: Diagnosis not present

## 2021-08-02 DIAGNOSIS — B351 Tinea unguium: Secondary | ICD-10-CM

## 2021-08-02 DIAGNOSIS — L600 Ingrowing nail: Secondary | ICD-10-CM

## 2021-08-04 NOTE — Progress Notes (Signed)
Subjective: 66 year old female presents today for follow-up evaluation of thick, discolored toenails, nail fungus.  She has been using topical medication.  Not sure how its been helping.  She also gets ingrown toenail to the left hallux nail border which causes discomfort at times.  No drainage or pus or any swelling or redness.  No new concerns.  Objective: AAO x3, NAD DP/PT pulses palpable bilaterally, CRT less than 3 seconds Nails are mostly unchanged.  There is continuation of the nails being hypertrophic, dystrophic with yellow, brown discoloration.  No edema, erythema.  Incurvation present to left nail border without any edema, erythema, drainage or pus or signs of infection.  No pain with calf compression, swelling, warmth, erythema  Assessment: Onychosis, ingrown toenail  Plan: -All treatment options discussed with the patient including all alternatives, risks, complications.  -Sharply debrided nails x10 without any complications or bleeding.  Continue topical medication.  Discussed partial nail avulsion of the ingrown toenail but she wishes to hold off on this but we did discuss the procedure as well as postoperative course today. -Patient encouraged to call the office with any questions, concerns, change in symptoms.   Vivi Barrack DPM

## 2021-09-19 ENCOUNTER — Encounter (HOSPITAL_BASED_OUTPATIENT_CLINIC_OR_DEPARTMENT_OTHER): Payer: Self-pay

## 2021-09-19 ENCOUNTER — Emergency Department (HOSPITAL_BASED_OUTPATIENT_CLINIC_OR_DEPARTMENT_OTHER)
Admission: EM | Admit: 2021-09-19 | Discharge: 2021-09-19 | Discharge status: Against Medical Advice | Payer: Medicare Other | Attending: Emergency Medicine | Admitting: Emergency Medicine

## 2021-09-19 ENCOUNTER — Other Ambulatory Visit: Payer: Self-pay

## 2021-09-19 DIAGNOSIS — Z5321 Procedure and treatment not carried out due to patient leaving prior to being seen by health care provider: Secondary | ICD-10-CM | POA: Insufficient documentation

## 2021-09-19 DIAGNOSIS — M545 Low back pain, unspecified: Secondary | ICD-10-CM | POA: Insufficient documentation

## 2021-09-19 LAB — URINALYSIS, ROUTINE W REFLEX MICROSCOPIC
Bilirubin Urine: NEGATIVE
Glucose, UA: NEGATIVE mg/dL
Hgb urine dipstick: NEGATIVE
Ketones, ur: NEGATIVE mg/dL
Leukocytes,Ua: NEGATIVE
Nitrite: NEGATIVE
Specific Gravity, Urine: 1.02 (ref 1.005–1.030)
pH: 7 (ref 5.0–8.0)

## 2021-09-19 NOTE — ED Triage Notes (Signed)
Pt presents POV with Right lower back pain radiating to her groin area starting this am. Pt denies any injury. Reports she is doing leg lifts at home for PT and unsure if that is the cause of the pain.  Pt denies kidney stones or urinary concerns. Normal BM today. Pt took some Aleve with some relief

## 2021-09-20 ENCOUNTER — Ambulatory Visit: Payer: Medicare Other

## 2021-09-21 ENCOUNTER — Other Ambulatory Visit: Payer: Self-pay

## 2021-09-21 ENCOUNTER — Ambulatory Visit
Admission: EM | Admit: 2021-09-21 | Discharge: 2021-09-21 | Disposition: A | Discharge status: Home / Self Care | Payer: Medicare Other | Attending: Physician Assistant | Admitting: Physician Assistant

## 2021-09-21 ENCOUNTER — Encounter: Payer: Self-pay | Admitting: Emergency Medicine

## 2021-09-21 DIAGNOSIS — M545 Low back pain, unspecified: Secondary | ICD-10-CM | POA: Diagnosis not present

## 2021-09-21 MED ORDER — PREDNISONE 20 MG PO TABS: 40.0000 mg | ORAL_TABLET | Freq: Every day | ORAL | 0 refills | Status: AC

## 2021-09-21 MED ORDER — TIZANIDINE HCL 4 MG PO TABS: 4.0000 mg | ORAL_TABLET | Freq: Four times a day (QID) | ORAL | 0 refills | Status: DC | PRN

## 2021-09-21 NOTE — ED Provider Notes (Signed)
[facility]    CSN: [id number] Arrival date & time: 09/21/21  1342      History   Chief Complaint Chief Complaint  Patient presents with   Back Pain    HPI Casey Newton is a 66 y.o. female.   Patient here today for evaluation of right lower back pain that will wrap around her right lower abdomen that has waxed and waned over the last few days. She went to ED a few days ago and had UA at that time which was clear. She LWBS due to wait times. She reports that back pain improved and then worsened. She has not had any numbness in her legs. She reports that certain movements and positions make pain worse, specifically standing. She denies any dysuria. She has tried aleve without significant improvement. She notes that pain feels similar to when she has had gas in her abdomen in the past and notes she has been mildly constipated recently.   The history is provided by the patient.  Back Pain Associated symptoms: abdominal pain   Associated symptoms: no fever and no numbness     Past Medical History:  Diagnosis Date   Arthritis    left hip   Hypertension    OSA on CPAP     Patient Active Problem List   Diagnosis Date Noted   Right buttock pain 08/10/2019   Dizziness 09/01/2018   Routine general medical examination at a health care facility 09/13/2015   Morbid obesity (HCC) 09/13/2014   Essential hypertension 09/13/2014   OSA (obstructive sleep apnea) 09/13/2014    Past Surgical History:  Procedure Laterality Date   ABDOMINAL HYSTERECTOMY     left ovaries   BUNIONECTOMY     left foot   CESAREAN SECTION     2 times   CHOLECYSTECTOMY N/A 11/12/2014   Procedure: LAPAROSCOPIC CHOLECYSTECTOMY;  Surgeon: Jimmye Norman, MD;  Location: [facility] OR;  Service: General;  Laterality: N/A;   Dental Implants     TONSILLECTOMY      OB History   No obstetric history on file.      Home Medications    Prior to Admission medications   Medication Sig Start Date End  Date Taking? Authorizing Provider  predniSONE (DELTASONE) 20 MG tablet Take 2 tablets (40 mg total) by mouth daily with breakfast for 5 days. 09/21/21 09/26/21 Yes Tomi Bamberger, PA-C  tiZANidine (ZANAFLEX) 4 MG tablet Take 1 tablet (4 mg total) by mouth every 6 (six) hours as needed for muscle spasms. 09/21/21  Yes Tomi Bamberger, PA-C  aspirin 81 MG tablet Take 81 mg by mouth daily. Patient not taking: Reported on 02/12/2021    [provider]  betamethasone valerate ointment (VALISONE) 0.1 % Apply 1 application topically 2 (two) times daily. 07/02/18   Myrlene Broker, MD  Omega-3 Fatty Acids (FISH OIL) 1000 MG CAPS Take 1,000 mg by mouth daily.     [provider]  PAPAYA PO Take 1 tablet by mouth as needed (for heartburn).     [provider]  telmisartan-hydrochlorothiazide (MICARDIS HCT) 80-12.5 MG tablet TAKE 1 TABLET BY MOUTH EVERY DAY 08/15/19   Myrlene Broker, MD  triamcinolone cream (KENALOG) 0.1 % Apply 1 application topically 2 (two) times daily. 12/21/17   Myrlene Broker, MD  Vitamin D, Ergocalciferol, (DRISDOL) 1.25 MG (50000 UNIT) CAPS capsule Take 1 capsule (50,000 Units total) by mouth every 7 (seven) days. 07/08/19   Myrlene Broker, MD  Family History Family History  Problem Relation Age of Onset   Liver disease Mother    Leukemia Father     Social History Social History   Tobacco Use   Smoking status: Never   Smokeless tobacco: Never  Substance Use Topics   Alcohol use: No    Alcohol/week: 0.0 standard drinks of alcohol   Drug use: No     Allergies   Penicillins   Review of Systems Review of Systems  Constitutional:  Negative for chills and fever.  Eyes:  Negative for discharge and redness.  Respiratory:  Negative for shortness of breath.   Gastrointestinal:  Positive for abdominal pain and constipation. Negative for nausea and vomiting.  Musculoskeletal:  Positive for back pain.  Neurological:   Negative for numbness.     Physical Exam Triage Vital Signs ED Triage Vitals  Enc Vitals Group     BP 09/21/21 1355 (!) 173/85     Pulse Rate 09/21/21 1355 (!) 57     Resp 09/21/21 1355 18     Temp 09/21/21 1355 98.5 F (36.9 C)     Temp Source 09/21/21 1355 Oral     SpO2 09/21/21 1355 96 %     Weight --      Height --      Head Circumference --      Peak Flow --      Pain Score 09/21/21 1356 7     Pain Loc --      Pain Edu? --      Excl. in [place]? --    No data found.  Updated Vital Signs BP (!) 173/85 (BP Location: Left Arm)   Pulse (!) 57   Temp 98.5 F (36.9 C) (Oral)   Resp 18   SpO2 96%     Physical Exam Vitals and nursing note reviewed.  Constitutional:      General: She is not in acute distress.    Appearance: Normal appearance. She is not ill-appearing.  HENT:     Head: Normocephalic and atraumatic.     Nose: Nose normal.  Cardiovascular:     Rate and Rhythm: Normal rate.  Pulmonary:     Effort: Pulmonary effort is normal. No respiratory distress.  Abdominal:     General: Abdomen is flat.  Musculoskeletal:     Comments: No TTP to midline spine, mild TTP to right lower back  Skin:    General: Skin is warm and dry.  Neurological:     Mental Status: She is alert.  Psychiatric:        Mood and Affect: Mood normal.        Thought Content: Thought content normal.      UC Treatments / Results  Labs (all labs ordered are listed, but only abnormal results are displayed) Labs Reviewed - No data to display  EKG   Radiology No results found.  Procedures Procedures (including critical care time)  Medications Ordered in UC Medications - No data to display  Initial Impression / Assessment and Plan / UC Course  I have reviewed the triage vital signs and the nursing notes.  Pertinent labs & imaging results that were available during my care of the patient were reviewed by me and considered in my medical decision making (see chart for  details).    Discussed possibility of muscular strain vs constipation/ gas causing symptoms. Will treat with steroid burst and muscle relaxer but also recommended stool softener and gas-X at home as well.  Encouraged follow up if no improvement or if symptoms worsen in any way.   Final Clinical Impressions(s) / UC Diagnoses   Final diagnoses:  Acute right-sided low back pain without sciatica   Discharge Instructions   None    ED Prescriptions     Medication Sig Dispense Auth. Provider   predniSONE (DELTASONE) 20 MG tablet Take 2 tablets (40 mg total) by mouth daily with breakfast for 5 days. 10 tablet Erma Pinto F, PA-C   tiZANidine (ZANAFLEX) 4 MG tablet Take 1 tablet (4 mg total) by mouth every 6 (six) hours as needed for muscle spasms. 30 tablet Tomi Bamberger, PA-C      PDMP not reviewed this encounter.   Tomi Bamberger, PA-C 09/21/21 1420

## 2021-09-21 NOTE — ED Triage Notes (Signed)
Pt here for right lower back pain; pt seen in ED 2 days ago for same but LWBS; pt had urine tested; pt sts pain went away and is now returned

## 2021-10-09 ENCOUNTER — Encounter (INDEPENDENT_AMBULATORY_CARE_PROVIDER_SITE_OTHER): Payer: Self-pay

## 2021-11-05 ENCOUNTER — Telehealth: Payer: Self-pay | Admitting: Podiatry

## 2021-11-05 NOTE — Telephone Encounter (Signed)
Wants to know how would she would know if her toenail fungus has cleared up. Has been using fungal cream for months now.  Please advise

## 2021-11-06 ENCOUNTER — Encounter: Payer: Self-pay | Admitting: Podiatry

## 2021-11-06 NOTE — Telephone Encounter (Signed)
Spoke to the patient and she said she just took a picture of her toe this morning is will upload it today.

## 2021-11-08 ENCOUNTER — Ambulatory Visit: Payer: Medicare Other | Admitting: Podiatry

## 2021-12-09 ENCOUNTER — Ambulatory Visit: Payer: Medicare Other | Admitting: Podiatry

## 2021-12-09 ENCOUNTER — Encounter: Payer: Self-pay | Admitting: Podiatry

## 2021-12-09 DIAGNOSIS — M79675 Pain in left toe(s): Secondary | ICD-10-CM

## 2021-12-09 DIAGNOSIS — M79674 Pain in right toe(s): Secondary | ICD-10-CM | POA: Diagnosis not present

## 2021-12-09 DIAGNOSIS — B351 Tinea unguium: Secondary | ICD-10-CM | POA: Diagnosis not present

## 2021-12-09 NOTE — Progress Notes (Signed)

## 2022-03-14 ENCOUNTER — Ambulatory Visit: Payer: Medicare HMO | Admitting: Podiatry

## 2022-03-14 ENCOUNTER — Encounter: Payer: Self-pay | Admitting: Podiatry

## 2022-03-14 DIAGNOSIS — M79675 Pain in left toe(s): Secondary | ICD-10-CM | POA: Diagnosis not present

## 2022-03-14 DIAGNOSIS — M79674 Pain in right toe(s): Secondary | ICD-10-CM

## 2022-03-14 DIAGNOSIS — B351 Tinea unguium: Secondary | ICD-10-CM | POA: Diagnosis not present

## 2022-03-14 NOTE — Progress Notes (Signed)

## 2022-04-10 ENCOUNTER — Encounter: Payer: Self-pay | Admitting: Emergency Medicine

## 2022-04-10 ENCOUNTER — Ambulatory Visit
Admission: EM | Admit: 2022-04-10 | Discharge: 2022-04-10 | Disposition: A | Discharge status: Home / Self Care | Payer: Medicare HMO

## 2022-04-10 ENCOUNTER — Ambulatory Visit (INDEPENDENT_AMBULATORY_CARE_PROVIDER_SITE_OTHER): Payer: Medicare HMO

## 2022-04-10 DIAGNOSIS — M79644 Pain in right finger(s): Secondary | ICD-10-CM | POA: Diagnosis not present

## 2022-04-10 DIAGNOSIS — M79641 Pain in right hand: Secondary | ICD-10-CM

## 2022-04-10 MED ORDER — PREDNISONE 20 MG PO TABS: 40.0000 mg | ORAL_TABLET | Freq: Every day | ORAL | 0 refills | Status: AC

## 2022-04-10 NOTE — ED Provider Notes (Signed)
[facility]    CSN: [id number] Arrival date & time: 04/10/22  1731      History   Chief Complaint Chief Complaint  Patient presents with   Hand Pain    HPI FELIX PRATT is a 67 y.o. female.   Patient here today for patient of right hand pain at her first MCP joint that has seemed to cause some numbness in her hand as well.  She states that certain movements including using her mouse at work tend to aggravate symptoms.  She has not had any injury that she is aware of.  She has tried taking Tylenol without resolution.  The history is provided by the patient.  Hand Pain    Past Medical History:  Diagnosis Date   Arthritis    left hip   Hypertension    OSA on CPAP     Patient Active Problem List   Diagnosis Date Noted   Pain due to onychomycosis of toenails of both feet 12/09/2021   Right buttock pain 08/10/2019   Dizziness 09/01/2018   Routine general medical examination at a health care facility 09/13/2015   Morbid obesity ([place]) 09/13/2014   Essential hypertension 09/13/2014   OSA (obstructive sleep apnea) 09/13/2014    Past Surgical History:  Procedure Laterality Date   ABDOMINAL HYSTERECTOMY     left ovaries   BUNIONECTOMY     left foot   CESAREAN SECTION     2 times   CHOLECYSTECTOMY N/A 11/12/2014   Procedure: LAPAROSCOPIC CHOLECYSTECTOMY;  Surgeon: Judeth Horn, MD;  Location: [facility];  Service: General;  Laterality: N/A;   Dental Implants     TONSILLECTOMY      OB History   No obstetric history on file.      Home Medications    Prior to Admission medications   Medication Sig Start Date End Date Taking? Authorizing Provider  OZEMPIC, 0.25 OR 0.5 MG/DOSE, 2 MG/3ML SOPN Inject 1 Stick into the skin once a week. 10/18/21  Yes [provider]  predniSONE (DELTASONE) 20 MG tablet Take 2 tablets (40 mg total) by mouth daily with breakfast for 5 days. 04/10/22 04/15/22 Yes Francene Finders, PA-C  aspirin 81 MG tablet Take 81 mg by  mouth daily.    [provider]  betamethasone valerate ointment (VALISONE) 0.1 % Apply 1 application topically 2 (two) times daily. 07/02/18   Hoyt Koch, MD  Omega-3 Fatty Acids (FISH OIL) 1000 MG CAPS Take 1,000 mg by mouth daily.     [provider]  PAPAYA PO Take 1 tablet by mouth as needed (for heartburn).     [provider]  telmisartan-hydrochlorothiazide (MICARDIS HCT) 80-12.5 MG tablet TAKE 1 TABLET BY MOUTH EVERY DAY 08/15/19   Hoyt Koch, MD  tiZANidine (ZANAFLEX) 4 MG tablet Take 1 tablet (4 mg total) by mouth every 6 (six) hours as needed for muscle spasms. 09/21/21   Francene Finders, PA-C  triamcinolone cream (KENALOG) 0.1 % Apply 1 application topically 2 (two) times daily. 12/21/17   Hoyt Koch, MD  Vitamin D, Ergocalciferol, (DRISDOL) 1.25 MG (50000 UNIT) CAPS capsule Take 1 capsule (50,000 Units total) by mouth every 7 (seven) days. 07/08/19   Hoyt Koch, MD    Family History Family History  Problem Relation Age of Onset   Liver disease Mother    Leukemia Father     Social History Social History   Tobacco Use   Smoking status: Never  Smokeless tobacco: Never  Substance Use Topics   Alcohol use: No    Alcohol/week: 0.0 standard drinks of alcohol   Drug use: No     Allergies   Penicillins   Review of Systems Review of Systems  Constitutional:  Negative for chills and fever.  Eyes:  Negative for discharge and redness.  Gastrointestinal:  Negative for nausea and vomiting.  Musculoskeletal:  Positive for arthralgias. Negative for joint swelling.  Neurological:  Positive for numbness.     Physical Exam Triage Vital Signs ED Triage Vitals  Enc Vitals Group     BP 04/10/22 1848 (!) 167/91     Pulse Rate 04/10/22 1848 (!) 57     Resp 04/10/22 1848 18     Temp 04/10/22 1848 98.3 F (36.8 C)     Temp src --      SpO2 04/10/22 1848 96 %     Weight --      Height --      Head  Circumference --      Peak Flow --      Pain Score 04/10/22 1846 7     Pain Loc --      Pain Edu? --      Excl. in [place]? --    No data found.  Updated Vital Signs BP (!) 167/91 (BP Location: Left Arm)   Pulse (!) 57   Temp 98.3 F (36.8 C)   Resp 18   SpO2 96%      Physical Exam Vitals and nursing note reviewed.  Constitutional:      General: She is not in acute distress.    Appearance: Normal appearance. She is not ill-appearing.  HENT:     Head: Normocephalic and atraumatic.  Eyes:     Conjunctiva/sclera: Conjunctivae normal.  Cardiovascular:     Rate and Rhythm: Normal rate.  Pulmonary:     Effort: Pulmonary effort is normal. No respiratory distress.  Musculoskeletal:     Comments: Normal appearance of right hand.  Normal range of motion of right fingers including thumb.  Neurological:     Mental Status: She is alert.     Comments: Gross sensation intact to right fingers distally  Psychiatric:        Mood and Affect: Mood normal.        Behavior: Behavior normal.        Thought Content: Thought content normal.      UC Treatments / Results  Labs (all labs ordered are listed, but only abnormal results are displayed) Labs Reviewed - No data to display  EKG   Radiology DG Hand Complete Right  Result Date: 04/10/2022 CLINICAL DATA:  Right thumb pain at the metacarpophalangeal joint. No known injury. EXAM: RIGHT HAND - COMPLETE 3+ VIEW COMPARISON:  None Available. FINDINGS: There is diffuse decreased bone mineralization. Minimal dorsal spurring at the thumb interphalangeal joint. The thumb metacarpophalangeal and interphalangeal joint spaces are maintained. Minimal thumb carpometacarpal joint space narrowing. Mild triscaphe joint space narrowing with moderate mid and distal scaphoid degenerative cysts and moderate lateral triscaphe peripheral degenerative osteophytes. Mild-to-moderate third metacarpophalangeal joint space narrowing with cystic changes within the  metacarpal head. Mild second through fifth DIP joint space narrowing. No acute fracture or dislocation. IMPRESSION: No significant osteoarthritis of the thumb metacarpophalangeal joint. 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 Electronically Signed   By: Yvonne Kendall M.D.   On: 04/10/2022 19:02    Procedures Procedures (  including critical care time)  Medications Ordered in UC Medications - No data to display  Initial Impression / Assessment and Plan / UC Course  I have reviewed the triage vital signs and the nursing notes.  Pertinent labs & imaging results that were available during my care of the patient were reviewed by me and considered in my medical decision making (see chart for details).    X-ray with some osteoarthritis noted.  Will treat with steroid burst and recommended follow-up with Ortho if no gradual improvement.  Patient expressed understanding.  Final Clinical Impressions(s) / UC Diagnoses   Final diagnoses:  Right hand pain   Discharge Instructions   None    ED Prescriptions     Medication Sig Dispense Auth. Provider   predniSONE (DELTASONE) 20 MG tablet Take 2 tablets (40 mg total) by mouth daily with breakfast for 5 days. 10 tablet Francene Finders, PA-C      PDMP not reviewed this encounter.   Francene Finders, PA-C 04/10/22 1955

## 2022-04-10 NOTE — ED Triage Notes (Addendum)
Pt c/o pain in base of right thumb and palm since Monday. Denies injury. Reports thumb will have numbness. Pain worse with palpation and makes grip or wiping after using restroom

## 2022-06-13 ENCOUNTER — Ambulatory Visit (INDEPENDENT_AMBULATORY_CARE_PROVIDER_SITE_OTHER): Payer: Medicare HMO | Admitting: Podiatry

## 2022-06-13 ENCOUNTER — Encounter: Payer: Self-pay | Admitting: Podiatry

## 2022-06-13 DIAGNOSIS — B351 Tinea unguium: Secondary | ICD-10-CM | POA: Diagnosis not present

## 2022-06-13 DIAGNOSIS — M79674 Pain in right toe(s): Secondary | ICD-10-CM

## 2022-06-13 DIAGNOSIS — M79675 Pain in left toe(s): Secondary | ICD-10-CM

## 2022-06-13 NOTE — Progress Notes (Addendum)

## 2022-07-25 NOTE — Progress Notes (Signed)
Sent message, via epic in basket, requesting orders in epic from surgeon.  

## 2022-07-28 ENCOUNTER — Ambulatory Visit: Payer: Self-pay | Admitting: Student

## 2022-07-30 ENCOUNTER — Ambulatory Visit: Payer: Self-pay | Admitting: Student

## 2022-07-30 ENCOUNTER — Encounter (HOSPITAL_COMMUNITY): Payer: Self-pay

## 2022-08-02 NOTE — Patient Instructions (Signed)
SURGICAL WAITING ROOM VISITATION Patients having surgery or a procedure may have no more than 2 support people in the waiting area - these visitors may rotate in the visitor waiting room.   Due to an increase in RSV and influenza rates and associated hospitalizations, children ages 72 and under may not visit patients in [facility] hospitals. If the patient needs to stay at the hospital during part of their recovery, the visitor guidelines for inpatient rooms apply.  PRE-OP VISITATION  Pre-op nurse will coordinate an appropriate time for 1 support person to accompany the patient in pre-op.  This support person may not rotate.  This visitor will be contacted when the time is appropriate for the visitor to come back in the pre-op area.  Please refer to the [facility] website for the visitor guidelines for Inpatients (after your surgery is over and you are in a regular room).  You are not required to quarantine at this time prior to your surgery. However, you must do this: Hand Hygiene often Do NOT share personal items Notify your provider if you are in close contact with someone who has COVID or you develop fever 100.4 or greater, new onset of sneezing, cough, sore throat, shortness of breath or body aches.  If you test positive for Covid or have been in contact with anyone that has tested positive in the last 10 days please notify you surgeon.    Your procedure is scheduled on: Thursday  August 14, 2022  Report to [facility] Main Entrance: [address] entrance where the [employer] is available.   Report to admitting at: 11:00 AM  +++++Call this number if you have any questions or problems the morning of surgery [phone]  Do not eat food after Midnight the night prior to your surgery/procedure.  After Midnight you may have the following liquids until  10:30 AM  DAY OF SURGERY  Clear Liquid Diet Water Black Coffee (sugar ok, NO MILK/CREAM OR CREAMERS)  Tea (sugar ok, NO  MILK/CREAM OR CREAMERS) regular and decaf                             Plain Jell-O  with no fruit (NO RED)                                           Fruit ices (not with fruit pulp, NO RED)                                     Popsicles (NO RED)                                                                  Juice: apple, WHITE grape, WHITE cranberry Sports drinks like Gatorade or Powerade (NO RED)                   The day of surgery:  Drink ONE (1) Pre-Surgery Clear Ensure  at  10:30 AM the morning of surgery. Drink in one sitting. Do not sip.  This drink was given to you during your hospital pre-op appointment visit. Nothing else to drink after completing the Pre-Surgery Clear Ensure : No candy, chewing gum or throat lozenges.    FOLLOW ANY ADDITIONAL PRE OP INSTRUCTIONS YOU RECEIVED FROM YOUR SURGEON'S OFFICE!!!   Oral Hygiene is also important to reduce your risk of infection.        Remember - BRUSH YOUR TEETH THE MORNING OF SURGERY WITH YOUR REGULAR TOOTHPASTE  Do NOT smoke after Midnight the night before surgery.  Take ONLY these medicines the morning of surgery with A SIP OF WATER: Omeprazole (Prilosec) and Tylenol if needed for pain   If You have been diagnosed with Sleep Apnea - Bring CPAP mask and tubing day of surgery. We will provide you with a CPAP machine on the day of your surgery.                   You may not have any metal on your body including hair pins, jewelry, and body piercing  Do not wear make-up, lotions, powders, perfumes or deodorant  Do not wear nail polish including gel and S&S, artificial / acrylic nails, or any other type of covering on natural nails including finger and toenails. If you have artificial nails, gel coating, etc., that needs to be removed by a nail salon, Please have this removed prior to surgery. Not doing so may mean that your surgery could be cancelled or delayed if the Surgeon or anesthesia staff feels like they are unable to  monitor you safely.   Do not shave 48 hours prior to surgery to avoid nicks in your skin which may contribute to postoperative infections.   Contacts, Hearing Aids, dentures or bridgework may not be worn into surgery. DENTURES WILL BE REMOVED PRIOR TO SURGERY PLEASE DO NOT APPLY "Poly grip" OR ADHESIVES!!!  You may bring a small overnight bag with you on the day of surgery, only pack items that are not valuable.  IS NOT RESPONSIBLE   FOR VALUABLES THAT ARE LOST OR STOLEN.   Do not bring your home medications to the hospital. The Pharmacy will dispense medications listed on your medication list to you during your admission in the Hospital.  Special Instructions: Bring a copy of your healthcare power of attorney and living will documents the day of surgery, if you wish to have them scanned into your [place] [facility] Records- EPIC  Please read over the following fact sheets you were given: IF YOU HAVE QUESTIONS ABOUT YOUR PRE-OP INSTRUCTIONS, PLEASE CALL [phone].         Pre-operative 5 CHG Bath Instructions   You can play a key role in reducing the risk of infection after surgery. Your skin needs to be as free of germs as possible. You can reduce the number of germs on your skin by washing with CHG (chlorhexidine gluconate) soap before surgery. CHG is an antiseptic soap that kills germs and continues to kill germs even after washing.   DO NOT use if you have an allergy to chlorhexidine/CHG or antibacterial soaps. If your skin becomes reddened or irritated, stop using the CHG and notify one of our RNs at [phone]  Please shower with the CHG soap starting 4 days before surgery using the following schedule: SUNDAY  August 10, 2022  Please keep in mind the following:  DO NOT shave, including  legs and underarms, starting the day of your first shower.   You may shave your face at any point before/day of surgery.   Place clean sheets on your bed the day you start using CHG soap. Use a clean washcloth (not used since being washed) for each shower. DO NOT sleep with pets once you start using the CHG.   CHG Shower Instructions:  If you choose to wash your hair and private area, wash first with your normal shampoo/soap.  After you use shampoo/soap, rinse your hair and body thoroughly to remove shampoo/soap residue.  Turn the water OFF and apply about 3 tablespoons (45 ml) of CHG soap to a CLEAN washcloth.  Apply CHG soap ONLY FROM YOUR NECK DOWN TO YOUR TOES (washing for 3-5 minutes)  DO NOT use CHG soap on face, private areas, open wounds, or sores.  Pay special attention to the area where your surgery is being performed.  If you are having back surgery, having someone wash your back for you may be helpful.  Wait 2 minutes after CHG soap is applied, then you may rinse off the CHG soap.  Pat dry with a clean towel  Put on clean clothes/pajamas   If you choose to wear lotion, please use ONLY the CHG-compatible lotions on the back of this paper.     Additional instructions for the day of surgery: DO NOT APPLY any lotions, deodorants, cologne, or perfumes.   Put on clean/comfortable clothes.  Brush your teeth.  Ask your nurse before applying any prescription medications to the skin.      CHG Compatible Lotions   Aveeno Moisturizing lotion  Cetaphil Moisturizing Cream  Cetaphil Moisturizing Lotion  Clairol Herbal Essence Moisturizing Lotion, Dry Skin  Clairol Herbal Essence Moisturizing Lotion, Extra Dry Skin  Clairol Herbal Essence Moisturizing Lotion, Normal Skin  Curel Age Defying Therapeutic Moisturizing Lotion with Alpha Hydroxy  Curel Extreme Care Body Lotion  Curel Soothing Hands Moisturizing Hand Lotion  Curel Therapeutic Moisturizing Cream, Fragrance-Free  Curel  Therapeutic Moisturizing Lotion, Fragrance-Free  Curel Therapeutic Moisturizing Lotion, Original Formula  Eucerin Daily Replenishing Lotion  Eucerin Dry Skin Therapy Plus Alpha Hydroxy Crme  Eucerin Dry Skin Therapy Plus Alpha Hydroxy Lotion  Eucerin Original Crme  Eucerin Original Lotion  Eucerin Plus Crme Eucerin Plus Lotion  Eucerin TriLipid Replenishing Lotion  Keri Anti-Bacterial Hand Lotion  Keri Deep Conditioning Original Lotion Dry Skin Formula Softly Scented  Keri Deep Conditioning Original Lotion, Fragrance Free Sensitive Skin Formula  Keri Lotion Fast Absorbing Fragrance Free Sensitive Skin Formula  Keri Lotion Fast Absorbing Softly Scented Dry Skin Formula  Keri Original Lotion  Keri Skin Renewal Lotion Keri Silky Smooth Lotion  Keri Silky Smooth Sensitive Skin Lotion  Nivea Body Creamy Conditioning Oil  Nivea Body Extra Enriched Lotion  Nivea Body Original Lotion  Nivea Body Sheer Moisturizing Lotion Nivea Crme  Nivea Skin Firming Lotion  NutraDerm 30 Skin Lotion  NutraDerm Skin Lotion  NutraDerm Therapeutic Skin Cream  NutraDerm Therapeutic Skin Lotion  ProShield Protective Hand Cream  Provon moisturizing lotion  FAILURE TO FOLLOW THESE INSTRUCTIONS MAY RESULT IN THE CANCELLATION OF YOUR SURGERY  PATIENT SIGNATURE_________________________________  NURSE SIGNATURE__________________________________  ________________________________________________________________________       Rogelia Mire    An incentive spirometer is a tool that can help keep your lungs clear and active. This tool measures how well you are filling your lungs with each breath. Taking long  deep breaths may help reverse or decrease the chance of developing breathing (pulmonary) problems (especially infection) following: A long period of time when you are unable to move or be active. BEFORE THE PROCEDURE  If the spirometer includes an indicator to show your best effort, your nurse  or respiratory therapist will set it to a desired goal. If possible, sit up straight or lean slightly forward. Try not to slouch. Hold the incentive spirometer in an upright position. INSTRUCTIONS FOR USE  Sit on the edge of your bed if possible, or sit up as far as you can in bed or on a chair. Hold the incentive spirometer in an upright position. Breathe out normally. Place the mouthpiece in your mouth and seal your lips tightly around it. Breathe in slowly and as deeply as possible, raising the piston or the ball toward the top of the column. Hold your breath for 3-5 seconds or for as long as possible. Allow the piston or ball to fall to the bottom of the column. Remove the mouthpiece from your mouth and breathe out normally. Rest for a few seconds and repeat Steps 1 through 7 at least 10 times every 1-2 hours when you are awake. Take your time and take a few normal breaths between deep breaths. The spirometer may include an indicator to show your best effort. Use the indicator as a goal to work toward during each repetition. After each set of 10 deep breaths, practice coughing to be sure your lungs are clear. If you have an incision (the cut made at the time of surgery), support your incision when coughing by placing a pillow or rolled up towels firmly against it. Once you are able to get out of bed, walk around indoors and cough well. You may stop using the incentive spirometer when instructed by your caregiver.  RISKS AND COMPLICATIONS Take your time so you do not get dizzy or light-headed. If you are in pain, you may need to take or ask for pain medication before doing incentive spirometry. It is harder to take a deep breath if you are having pain. AFTER USE Rest and breathe slowly and easily. It can be helpful to keep track of a log of your progress. Your caregiver can provide you with a simple table to help with this. If you are using the spirometer at home, follow these  instructions: SEEK MEDICAL CARE IF:  You are having difficultly using the spirometer. You have trouble using the spirometer as often as instructed. Your pain medication is not giving enough relief while using the spirometer. You develop fever of 100.5 F (38.1 C) or higher.                                                                                                    SEEK IMMEDIATE MEDICAL CARE IF:  You cough up bloody sputum that had not been present before. You develop fever of 102 F (38.9 C) or greater. You develop worsening pain at or near the incision site. MAKE SURE YOU:  Understand these instructions. Will watch your condition. Will  get help right away if you are not doing well or get worse. Document Released: 06/30/2006 Document Revised: 05/12/2011 Document Reviewed: 08/31/2006     [facility] Patient Information 2014 [place], [place].

## 2022-08-02 NOTE — Progress Notes (Signed)
COVID Vaccine received:  []  No [x]  Yes Date of any COVID positive Test in last 90 days:  PCP - No PCP Cardiologist -   Chest x-ray -  EKG -  12-26-2009  Epic  Will Repeat at PST Stress Test -  ECHO -  Cardiac Cath -  ZIO monitor- 07-28-2019  Epic  PCR screen: [x]  Ordered & Completed           []   No Order but Needs PROFEND           []   N/A for this surgery  Surgery Plan:  []  Ambulatory                            [x]  Outpatient in bed                            []  Admit  Anesthesia:    []  General  [x]  Spinal                           []   Choice []   MAC  Pacemaker / ICD device [x]  No []  Yes   Spinal Cord Stimulator:[x]  No []  Yes       History of Sleep Apnea? []  No [x]  Yes   CPAP used?- []  No [x]  Yes    Does the patient monitor blood sugar?          []  No []  Yes  [x]  N/A  Patient has: [x]  NO Hx DM   []  Pre-DM                 []  DM1  []   DM2 Does patient have a [employer] or Dexacom? []  No []  Yes   Fasting Blood Sugar Ranges-  Checks Blood Sugar _____ times a day  Blood Thinner / Instructions:  none Aspirin Instructions:None  ERAS Protocol Ordered: []  No  [x]  Yes PRE-SURGERY [x]  ENSURE  []  G2  Patient is to be NPO after: 10:30    Patient was given the 5 CHG shower / bath instructions for THA / TKA / Total or Reverse Shoulder arthroplasty surgery along with 2 bottles of the CHG soap. Patient will start this on: Sunday  August 10, 2022   All questions were asked and answered, Patient voiced understanding of this process.   Activity level: Patient is able / unable to climb a flight of stairs without difficulty; []  No CP  []  No SOB, but would have ___   Patient can / can not perform ADLs without assistance.   Anesthesia review: OSA-CPAP, HTN, GERD  Patient denies shortness of breath, fever, cough and chest pain at PAT appointment.  Patient verbalized understanding and agreement to the Pre-Surgical Instructions that were given to them at this PAT appointment. Patient  was also educated of the need to review these PAT instructions again prior to her surgery.I reviewed the appropriate phone numbers to call if they have any and questions or concerns.

## 2022-08-04 ENCOUNTER — Encounter (HOSPITAL_COMMUNITY)
Admission: RE | Admit: 2022-08-04 | Discharge: 2022-08-04 | Disposition: A | Discharge status: Home / Self Care | Payer: Medicare HMO | Source: Ambulatory Visit | Attending: Orthopedic Surgery | Admitting: Orthopedic Surgery

## 2022-08-04 ENCOUNTER — Ambulatory Visit: Payer: Self-pay | Admitting: Student

## 2022-08-04 ENCOUNTER — Other Ambulatory Visit: Payer: Self-pay

## 2022-08-04 ENCOUNTER — Encounter (HOSPITAL_COMMUNITY): Payer: Self-pay

## 2022-08-04 VITALS — BP 130/69 | HR 56 | Temp 98.6°F | Resp 20 | Ht 61.5 in | Wt 213.0 lb

## 2022-08-04 DIAGNOSIS — G4733 Obstructive sleep apnea (adult) (pediatric): Secondary | ICD-10-CM | POA: Diagnosis not present

## 2022-08-04 DIAGNOSIS — K219 Gastro-esophageal reflux disease without esophagitis: Secondary | ICD-10-CM | POA: Diagnosis not present

## 2022-08-04 DIAGNOSIS — Z01818 Encounter for other preprocedural examination: Secondary | ICD-10-CM | POA: Insufficient documentation

## 2022-08-04 DIAGNOSIS — I10 Essential (primary) hypertension: Secondary | ICD-10-CM | POA: Insufficient documentation

## 2022-08-04 HISTORY — DX: Gastro-esophageal reflux disease without esophagitis: K21.9

## 2022-08-04 LAB — CBC
HCT: 39.1 % (ref 36.0–46.0)
Hemoglobin: 12.5 g/dL (ref 12.0–15.0)
MCH: 28.7 pg (ref 26.0–34.0)
MCHC: 32 g/dL (ref 30.0–36.0)
MCV: 89.9 fL (ref 80.0–100.0)
Platelets: 259 10*3/uL (ref 150–400)
RBC: 4.35 MIL/uL (ref 3.87–5.11)
RDW: 14.3 % (ref 11.5–15.5)
WBC: 5.3 10*3/uL (ref 4.0–10.5)
nRBC: 0 % (ref 0.0–0.2)

## 2022-08-04 LAB — BASIC METABOLIC PANEL
Anion gap: 9 (ref 5–15)
BUN: 12 mg/dL (ref 8–23)
CO2: 27 mmol/L (ref 22–32)
Calcium: 9.3 mg/dL (ref 8.9–10.3)
Chloride: 102 mmol/L (ref 98–111)
Creatinine, Ser: 0.84 mg/dL (ref 0.44–1.00)
GFR, Estimated: >60 mL/min (ref >60)
Glucose, Bld: 95 mg/dL (ref 70–99)
Potassium: 3.7 mmol/L (ref 3.5–5.1)
Sodium: 138 mmol/L (ref 135–145)

## 2022-08-04 LAB — SURGICAL PCR SCREEN
MRSA, PCR: NEGATIVE
Staphylococcus aureus: NEGATIVE

## 2022-08-04 NOTE — H&P (View-Only) (Signed)
TOTAL HIP ADMISSION H&P  Patient is admitted for left total hip arthroplasty.  Subjective:  Chief Complaint: left hip pain  HPI: Casey Newton, 67 y.o. female, has a history of pain and functional disability in the left hip(s) due to arthritis and patient has failed non-surgical conservative treatments for greater than 12 weeks to include NSAID's and/or analgesics, flexibility and strengthening excercises, use of assistive devices, weight reduction as appropriate, and activity modification.  Onset of symptoms was gradual starting 10 years ago with rapidlly worsening course since that time.The patient noted no past surgery on the left hip(s).  Patient currently rates pain in the left hip at 10 out of 10 with activity. Patient has night pain, worsening of pain with activity and weight bearing, trendelenberg gait, pain that interfers with activities of daily living, and pain with passive range of motion. Patient has evidence of subchondral cysts, subchondral sclerosis, periarticular osteophytes, and joint space narrowing by imaging studies. This condition presents safety issues increasing the risk of falls. There is no current active infection.  Patient Active Problem List   Diagnosis Date Noted   Pain due to onychomycosis of toenails of both feet 12/09/2021   Right buttock pain 08/10/2019   Dizziness 09/01/2018   Routine general medical examination at a health care facility 09/13/2015   Morbid obesity (HCC) 09/13/2014   Essential hypertension 09/13/2014   OSA (obstructive sleep apnea) 09/13/2014   Past Medical History:  Diagnosis Date   Arthritis    left hip   GERD (gastroesophageal reflux disease)    Hypertension    OSA on CPAP     Past Surgical History:  Procedure Laterality Date   ABDOMINAL HYSTERECTOMY     left ovaries   BUNIONECTOMY     left foot   CESAREAN SECTION     2 times   CHOLECYSTECTOMY N/A 11/12/2014   Procedure: LAPAROSCOPIC CHOLECYSTECTOMY;  Surgeon: James  Wyatt, MD;  Location: [facility] OR;  Service: General;  Laterality: N/A;   Dental Implants     DILATION AND CURETTAGE OF UTERUS     TONSILLECTOMY      Current Outpatient Medications  Medication Sig Dispense Refill Last Dose   acetaminophen (TYLENOL) 650 MG CR tablet Take 1,300 mg by mouth 2 (two) times daily.      betamethasone valerate ointment (VALISONE) 0.1 % Apply 1 application topically 2 (two) times daily. (Patient taking differently: Apply 1 application  topically 2 (two) times daily as needed (rash).) 30 g 0    cetirizine (ZYRTEC) 10 MG tablet Take 10 mg by mouth daily as needed for allergies.      Cholecalciferol (VITAMIN D3 MAXIMUM STRENGTH) 125 MCG (5000 UT) capsule Take 5,000 Units by mouth daily.      omeprazole (PRILOSEC) 40 MG capsule Take 40 mg by mouth every morning.      OVER THE COUNTER MEDICATION Apply 1 Application topically daily as needed (pain). Hempvana cream      PAPAYA PO Take 3 tablets by mouth as needed (for heartburn).      telmisartan-hydrochlorothiazide (MICARDIS HCT) 80-12.5 MG tablet TAKE 1 TABLET BY MOUTH EVERY DAY 90 tablet 1    White Petrolatum-Mineral Oil (SYSTANE NIGHTTIME) OINT Apply 1 Application to eye at bedtime.      No current facility-administered medications for this visit.   Allergies  Allergen Reactions   Penicillins Nausea Only and Other (See Comments)    Upset stomach    Social History   Tobacco Use   Smoking status:   Never   Smokeless tobacco: Never  Substance Use Topics   Alcohol use: No    Alcohol/week: 0.0 standard drinks of alcohol    Family History  Problem Relation Age of Onset   Liver disease Mother    Leukemia Father      Review of Systems  Musculoskeletal:  Positive for arthralgias and gait problem.  All other systems reviewed and are negative.   Objective:  Physical Exam Constitutional:      Appearance: Normal appearance.  HENT:     Head: Normocephalic and atraumatic.     Nose: Nose normal.     Mouth/Throat:      Mouth: Mucous membranes are moist.     Pharynx: Oropharynx is clear.  Eyes:     Conjunctiva/sclera: Conjunctivae normal.  Cardiovascular:     Rate and Rhythm: Normal rate and regular rhythm.     Pulses: Normal pulses.     Heart sounds: Normal heart sounds.  Pulmonary:     Effort: Pulmonary effort is normal.     Breath sounds: Normal breath sounds.  Abdominal:     General: Abdomen is flat.     Palpations: Abdomen is soft.  Genitourinary:    Comments: deferred Musculoskeletal:     Cervical back: Normal range of motion and neck supple.     Comments: Examination of the left hip reveals no skin wounds or lesions. She does have trochanteric tenderness to palpation. The left hip is extremely stiff. She has a 50 degree flexion contracture, and I can bend her up to 80 degrees of flexion. She internally rotates 0 degrees, and she externally rotates 10 degrees. Pain with terminal flexion and rotation. Pain in the position of impingement. Positive Stinchfield.  Distally, there is no focal motor or sensory deficit. She has palpable pedal pulses.  She ambulates with an antalgic gait.  Skin:    General: Skin is warm and dry.     Capillary Refill: Capillary refill takes less than 2 seconds.  Neurological:     General: No focal deficit present.     Mental Status: She is alert and oriented to person, place, and time.  Psychiatric:        Mood and Affect: Mood normal.        Behavior: Behavior normal.        Thought Content: Thought content normal.        Judgment: Judgment normal.     Vital signs in last 24 hours: @VSRANGES@  Labs:   Estimated body mass index is 39.59 kg/m as calculated from the following:   Height as of an earlier encounter on 08/04/22: 5' 1.5" (1.562 m).   Weight as of an earlier encounter on 08/04/22: 96.6 kg.   Imaging Review Plain radiographs demonstrate severe degenerative joint disease of the left hip(s). The bone quality appears to be adequate for age and  reported activity level.      Assessment/Plan:  End stage arthritis, left hip(s)  The patient history, physical examination, clinical judgement of the provider and imaging studies are consistent with end stage degenerative joint disease of the left hip(s) and total hip arthroplasty is deemed medically necessary. The treatment options including medical management, injection therapy, arthroscopy and arthroplasty were discussed at length. The risks and benefits of total hip arthroplasty were presented and reviewed. The risks due to aseptic loosening, infection, stiffness, dislocation/subluxation,  thromboembolic complications and other imponderables were discussed.  The patient acknowledged the explanation, agreed to proceed with the plan and consent   was signed. Patient is being admitted for inpatient treatment for surgery, pain control, PT, OT, prophylactic antibiotics, VTE prophylaxis, progressive ambulation and ADL's and discharge planning.The patient is planning to be discharged home with HEP after an overnight stay.   Therapy Plans: HEP.  Disposition: Home with husband and daughter Planned DVT Prophylaxis: aspirin 81mg BID DME needed: walker. Has cane. PCP: Cleared.  TXA: IV Allergies:  - Penicillins - stomach ache.  Anesthesia Concerns: None.  BMI: 40.0 Last HgbA1c: Not diabetic.  Other: - OSA, uses CPAP.  - Hydrocodone, zofran. NO NSAIDs due to prior creatinine effects.  - 08/04/22: Hgb 12.5, K+ 3.7, Cr. 0.89.     Patient's anticipated LOS is less than 2 midnights, meeting these requirements: - Younger than 65 - Lives within 1 hour of care - Has a competent adult at home to recover with post-op recover - NO history of  - Chronic pain requiring opiods  - Diabetes  - Coronary Artery Disease  - Heart failure  - Heart attack  - Stroke  - DVT/VTE  - Cardiac arrhythmia  - Respiratory Failure/COPD  - Renal failure  - Anemia  - Advanced Liver disease   

## 2022-08-04 NOTE — H&P (Signed)
TOTAL HIP ADMISSION H&P  Patient is admitted for left total hip arthroplasty.  Subjective:  Chief Complaint: left hip pain  HPI: Casey Newton, 67 y.o. female, has a history of pain and functional disability in the left hip(s) due to arthritis and patient has failed non-surgical conservative treatments for greater than 12 weeks to include NSAID's and/or analgesics, flexibility and strengthening excercises, use of assistive devices, weight reduction as appropriate, and activity modification.  Onset of symptoms was gradual starting 10 years ago with rapidlly worsening course since that time.The patient noted no past surgery on the left hip(s).  Patient currently rates pain in the left hip at 10 out of 10 with activity. Patient has night pain, worsening of pain with activity and weight bearing, trendelenberg gait, pain that interfers with activities of daily living, and pain with passive range of motion. Patient has evidence of subchondral cysts, subchondral sclerosis, periarticular osteophytes, and joint space narrowing by imaging studies. This condition presents safety issues increasing the risk of falls. There is no current active infection.  Patient Active Problem List   Diagnosis Date Noted   Pain due to onychomycosis of toenails of both feet 12/09/2021   Right buttock pain 08/10/2019   Dizziness 09/01/2018   Routine general medical examination at a health care facility 09/13/2015   Morbid obesity (HCC) 09/13/2014   Essential hypertension 09/13/2014   OSA (obstructive sleep apnea) 09/13/2014   Past Medical History:  Diagnosis Date   Arthritis    left hip   GERD (gastroesophageal reflux disease)    Hypertension    OSA on CPAP     Past Surgical History:  Procedure Laterality Date   ABDOMINAL HYSTERECTOMY     left ovaries   BUNIONECTOMY     left foot   CESAREAN SECTION     2 times   CHOLECYSTECTOMY N/A 11/12/2014   Procedure: LAPAROSCOPIC CHOLECYSTECTOMY;  Surgeon: Jimmye Norman, MD;  Location: [facility] OR;  Service: General;  Laterality: N/A;   Dental Implants     DILATION AND CURETTAGE OF UTERUS     TONSILLECTOMY      Current Outpatient Medications  Medication Sig Dispense Refill Last Dose   acetaminophen (TYLENOL) 650 MG CR tablet Take 1,300 mg by mouth 2 (two) times daily.      betamethasone valerate ointment (VALISONE) 0.1 % Apply 1 application topically 2 (two) times daily. (Patient taking differently: Apply 1 application  topically 2 (two) times daily as needed (rash).) 30 g 0    cetirizine (ZYRTEC) 10 MG tablet Take 10 mg by mouth daily as needed for allergies.      Cholecalciferol (VITAMIN D3 MAXIMUM STRENGTH) 125 MCG (5000 UT) capsule Take 5,000 Units by mouth daily.      omeprazole (PRILOSEC) 40 MG capsule Take 40 mg by mouth every morning.      OVER THE COUNTER MEDICATION Apply 1 Application topically daily as needed (pain). Hempvana cream      PAPAYA PO Take 3 tablets by mouth as needed (for heartburn).      telmisartan-hydrochlorothiazide (MICARDIS HCT) 80-12.5 MG tablet TAKE 1 TABLET BY MOUTH EVERY DAY 90 tablet 1    White Petrolatum-Mineral Oil (SYSTANE NIGHTTIME) OINT Apply 1 Application to eye at bedtime.      No current facility-administered medications for this visit.   Allergies  Allergen Reactions   Penicillins Nausea Only and Other (See Comments)    Upset stomach    Social History   Tobacco Use   Smoking status:  Never   Smokeless tobacco: Never  Substance Use Topics   Alcohol use: No    Alcohol/week: 0.0 standard drinks of alcohol    Family History  Problem Relation Age of Onset   Liver disease Mother    Leukemia Father      Review of Systems  Musculoskeletal:  Positive for arthralgias and gait problem.  All other systems reviewed and are negative.   Objective:  Physical Exam Constitutional:      Appearance: Normal appearance.  HENT:     Head: Normocephalic and atraumatic.     Nose: Nose normal.     Mouth/Throat:      Mouth: Mucous membranes are moist.     Pharynx: Oropharynx is clear.  Eyes:     Conjunctiva/sclera: Conjunctivae normal.  Cardiovascular:     Rate and Rhythm: Normal rate and regular rhythm.     Pulses: Normal pulses.     Heart sounds: Normal heart sounds.  Pulmonary:     Effort: Pulmonary effort is normal.     Breath sounds: Normal breath sounds.  Abdominal:     General: Abdomen is flat.     Palpations: Abdomen is soft.  Genitourinary:    Comments: deferred Musculoskeletal:     Cervical back: Normal range of motion and neck supple.     Comments: Examination of the left hip reveals no skin wounds or lesions. She does have trochanteric tenderness to palpation. The left hip is extremely stiff. She has a 50 degree flexion contracture, and I can bend her up to 80 degrees of flexion. She internally rotates 0 degrees, and she externally rotates 10 degrees. Pain with terminal flexion and rotation. Pain in the position of impingement. Positive Stinchfield.  Distally, there is no focal motor or sensory deficit. She has palpable pedal pulses.  She ambulates with an antalgic gait.  Skin:    General: Skin is warm and dry.     Capillary Refill: Capillary refill takes less than 2 seconds.  Neurological:     General: No focal deficit present.     Mental Status: She is alert and oriented to person, place, and time.  Psychiatric:        Mood and Affect: Mood normal.        Behavior: Behavior normal.        Thought Content: Thought content normal.        Judgment: Judgment normal.     Vital signs in last 24 hours: @VSRANGES @  Labs:   Estimated body mass index is 39.59 kg/m as calculated from the following:   Height as of an earlier encounter on 08/04/22: 5' 1.5" (1.562 m).   Weight as of an earlier encounter on 08/04/22: 96.6 kg.   Imaging Review Plain radiographs demonstrate severe degenerative joint disease of the left hip(s). The bone quality appears to be adequate for age and  reported activity level.      Assessment/Plan:  End stage arthritis, left hip(s)  The patient history, physical examination, clinical judgement of the provider and imaging studies are consistent with end stage degenerative joint disease of the left hip(s) and total hip arthroplasty is deemed medically necessary. The treatment options including medical management, injection therapy, arthroscopy and arthroplasty were discussed at length. The risks and benefits of total hip arthroplasty were presented and reviewed. The risks due to aseptic loosening, infection, stiffness, dislocation/subluxation,  thromboembolic complications and other imponderables were discussed.  The patient acknowledged the explanation, agreed to proceed with the plan and consent  was signed. Patient is being admitted for inpatient treatment for surgery, pain control, PT, OT, prophylactic antibiotics, VTE prophylaxis, progressive ambulation and ADL's and discharge planning.The patient is planning to be discharged home with HEP after an overnight stay.   Therapy Plans: HEP.  Disposition: Home with husband and daughter Planned DVT Prophylaxis: aspirin 81mg  BID DME needed: walker. Has cane. PCP: Cleared.  TXA: IV Allergies:  - Penicillins - stomach ache.  Anesthesia Concerns: None.  BMI: 40.0 Last HgbA1c: Not diabetic.  Other: - OSA, uses CPAP.  - Hydrocodone, zofran. NO NSAIDs due to prior creatinine effects.  - 08/04/22: Hgb 12.5, K+ 3.7, Cr. 0.89.     Patient's anticipated LOS is less than 2 midnights, meeting these requirements: - Younger than 46 - Lives within 1 hour of care - Has a competent adult at home to recover with post-op recover - NO history of  - Chronic pain requiring opiods  - Diabetes  - Coronary Artery Disease  - Heart failure  - Heart attack  - Stroke  - DVT/VTE  - Cardiac arrhythmia  - Respiratory Failure/COPD  - Renal failure  - Anemia  - Advanced Liver disease

## 2022-08-14 ENCOUNTER — Ambulatory Visit (HOSPITAL_COMMUNITY): Payer: Medicare HMO

## 2022-08-14 ENCOUNTER — Encounter (HOSPITAL_COMMUNITY): Payer: Self-pay | Admitting: Orthopedic Surgery

## 2022-08-14 ENCOUNTER — Other Ambulatory Visit: Payer: Self-pay

## 2022-08-14 ENCOUNTER — Ambulatory Visit (HOSPITAL_COMMUNITY): Payer: Medicare HMO | Admitting: Registered Nurse

## 2022-08-14 ENCOUNTER — Encounter (HOSPITAL_COMMUNITY)
Admission: RE | Disposition: A | Discharge status: Home / Self Care | Payer: Self-pay | Source: Ambulatory Visit | Attending: Orthopedic Surgery

## 2022-08-14 ENCOUNTER — Ambulatory Visit (HOSPITAL_BASED_OUTPATIENT_CLINIC_OR_DEPARTMENT_OTHER): Payer: Medicare HMO | Admitting: Registered Nurse

## 2022-08-14 ENCOUNTER — Ambulatory Visit (HOSPITAL_COMMUNITY)
Admission: RE | Admit: 2022-08-14 | Discharge: 2022-08-15 | Disposition: A | Discharge status: Home / Self Care | Payer: Medicare HMO | Source: Ambulatory Visit | Attending: Orthopedic Surgery | Admitting: Orthopedic Surgery

## 2022-08-14 DIAGNOSIS — M1612 Unilateral primary osteoarthritis, left hip: Secondary | ICD-10-CM | POA: Diagnosis present

## 2022-08-14 DIAGNOSIS — M25752 Osteophyte, left hip: Secondary | ICD-10-CM | POA: Insufficient documentation

## 2022-08-14 DIAGNOSIS — G4733 Obstructive sleep apnea (adult) (pediatric): Secondary | ICD-10-CM | POA: Insufficient documentation

## 2022-08-14 DIAGNOSIS — I10 Essential (primary) hypertension: Secondary | ICD-10-CM | POA: Insufficient documentation

## 2022-08-14 DIAGNOSIS — Z96642 Presence of left artificial hip joint: Secondary | ICD-10-CM

## 2022-08-14 DIAGNOSIS — K219 Gastro-esophageal reflux disease without esophagitis: Secondary | ICD-10-CM | POA: Diagnosis not present

## 2022-08-14 HISTORY — PX: TOTAL HIP ARTHROPLASTY: SHX124

## 2022-08-14 LAB — TYPE AND SCREEN
ABO/RH(D): O POS
Antibody Screen: NEGATIVE

## 2022-08-14 LAB — ABO/RH: ABO/RH(D): O POS

## 2022-08-14 SURGERY — ARTHROPLASTY, HIP, TOTAL, ANTERIOR APPROACH
Anesthesia: Spinal | Site: Hip | Laterality: Left

## 2022-08-14 MED ORDER — DEXAMETHASONE SODIUM PHOSPHATE 10 MG/ML IJ SOLN
INTRAMUSCULAR | Status: DC | PRN
  Administered 2022-08-14: 8 mg via INTRAVENOUS

## 2022-08-14 MED ORDER — CELECOXIB 200 MG PO CAPS
200.0000 mg | ORAL_CAPSULE | Freq: Two times a day (BID) | ORAL | Status: DC
  Administered 2022-08-14: 200 mg via ORAL
  Filled 2022-08-14: qty 1

## 2022-08-14 MED ORDER — HYDROCODONE-ACETAMINOPHEN 7.5-325 MG PO TABS: 1.0000 | ORAL_TABLET | ORAL | Status: DC | PRN

## 2022-08-14 MED ORDER — MIDAZOLAM HCL 2 MG/2ML IJ SOLN
INTRAMUSCULAR | Status: AC
  Filled 2022-08-14: qty 2

## 2022-08-14 MED ORDER — ONDANSETRON HCL 4 MG/2ML IJ SOLN: 4.0000 mg | Freq: Once | INTRAMUSCULAR | Status: DC | PRN

## 2022-08-14 MED ORDER — ACETAMINOPHEN 10 MG/ML IV SOLN
INTRAVENOUS | Status: AC
  Filled 2022-08-14: qty 100

## 2022-08-14 MED ORDER — ACETAMINOPHEN 325 MG PO TABS: 325.0000 mg | ORAL_TABLET | Freq: Four times a day (QID) | ORAL | Status: DC | PRN

## 2022-08-14 MED ORDER — DOCUSATE SODIUM 100 MG PO CAPS
100.0000 mg | ORAL_CAPSULE | Freq: Two times a day (BID) | ORAL | Status: DC
  Administered 2022-08-14 – 2022-08-15 (×2): 100 mg via ORAL
  Filled 2022-08-14 (×2): qty 1

## 2022-08-14 MED ORDER — LORATADINE 10 MG PO TABS
10.0000 mg | ORAL_TABLET | Freq: Every day | ORAL | Status: DC
  Administered 2022-08-15: 10 mg via ORAL
  Filled 2022-08-14: qty 1

## 2022-08-14 MED ORDER — FENTANYL CITRATE (PF) 100 MCG/2ML IJ SOLN
INTRAMUSCULAR | Status: AC
  Filled 2022-08-14: qty 2

## 2022-08-14 MED ORDER — ONDANSETRON HCL 4 MG/2ML IJ SOLN
INTRAMUSCULAR | Status: AC
  Filled 2022-08-14: qty 2

## 2022-08-14 MED ORDER — HYDROCODONE-ACETAMINOPHEN 5-325 MG PO TABS
1.0000 | ORAL_TABLET | ORAL | Status: DC | PRN
  Administered 2022-08-14: 1 via ORAL
  Administered 2022-08-14 – 2022-08-15 (×3): 2 via ORAL
  Filled 2022-08-14: qty 1
  Filled 2022-08-14 (×3): qty 2

## 2022-08-14 MED ORDER — METOCLOPRAMIDE HCL 5 MG/ML IJ SOLN: 5.0000 mg | Freq: Three times a day (TID) | INTRAMUSCULAR | Status: DC | PRN

## 2022-08-14 MED ORDER — OXYCODONE HCL 5 MG/5ML PO SOLN: 5.0000 mg | Freq: Once | ORAL | Status: DC | PRN

## 2022-08-14 MED ORDER — FENTANYL CITRATE (PF) 100 MCG/2ML IJ SOLN
INTRAMUSCULAR | Status: DC | PRN
  Administered 2022-08-14: 25 ug via INTRAVENOUS

## 2022-08-14 MED ORDER — ARTIFICIAL TEARS OPHTHALMIC OINT
1.0000 | TOPICAL_OINTMENT | Freq: Every day | OPHTHALMIC | Status: DC
  Filled 2022-08-14: qty 3.5

## 2022-08-14 MED ORDER — VITAMIN D 25 MCG (1000 UNIT) PO TABS
5000.0000 [IU] | ORAL_TABLET | Freq: Every day | ORAL | Status: DC
  Administered 2022-08-15: 5000 [IU] via ORAL
  Filled 2022-08-14: qty 5

## 2022-08-14 MED ORDER — ORAL CARE MOUTH RINSE: 15.0000 mL | Freq: Once | OROMUCOSAL | Status: AC

## 2022-08-14 MED ORDER — ONDANSETRON HCL 4 MG PO TABS: 4.0000 mg | ORAL_TABLET | Freq: Four times a day (QID) | ORAL | Status: DC | PRN

## 2022-08-14 MED ORDER — KETOROLAC TROMETHAMINE 30 MG/ML IJ SOLN
INTRAMUSCULAR | Status: AC
  Filled 2022-08-14: qty 1

## 2022-08-14 MED ORDER — TRANEXAMIC ACID-NACL 1000-0.7 MG/100ML-% IV SOLN
1000.0000 mg | INTRAVENOUS | Status: AC
  Administered 2022-08-14: 1000 mg via INTRAVENOUS
  Filled 2022-08-14: qty 100

## 2022-08-14 MED ORDER — HYDROMORPHONE HCL 1 MG/ML IJ SOLN: 0.2500 mg | INTRAMUSCULAR | Status: DC | PRN

## 2022-08-14 MED ORDER — SODIUM CHLORIDE 0.9 % IV SOLN: INTRAVENOUS | Status: DC

## 2022-08-14 MED ORDER — ONDANSETRON HCL 4 MG/2ML IJ SOLN
INTRAMUSCULAR | Status: DC | PRN
  Administered 2022-08-14: 4 mg via INTRAVENOUS

## 2022-08-14 MED ORDER — AMISULPRIDE (ANTIEMETIC) 5 MG/2ML IV SOLN: 10.0000 mg | Freq: Once | INTRAVENOUS | Status: DC | PRN

## 2022-08-14 MED ORDER — GLYCOPYRROLATE 0.2 MG/ML IJ SOLN
INTRAMUSCULAR | Status: AC
  Filled 2022-08-14: qty 1

## 2022-08-14 MED ORDER — MENTHOL 3 MG MT LOZG
1.0000 | LOZENGE | OROMUCOSAL | Status: DC | PRN
  Administered 2022-08-14 (×2): 3 mg via ORAL
  Filled 2022-08-14: qty 9

## 2022-08-14 MED ORDER — PHENOL 1.4 % MT LIQD: 1.0000 | OROMUCOSAL | Status: DC | PRN

## 2022-08-14 MED ORDER — KETOROLAC TROMETHAMINE 30 MG/ML IJ SOLN
INTRAMUSCULAR | Status: DC | PRN
  Administered 2022-08-14: 30 mg via INTRAMUSCULAR

## 2022-08-14 MED ORDER — DEXAMETHASONE SODIUM PHOSPHATE 10 MG/ML IJ SOLN
INTRAMUSCULAR | Status: AC
  Filled 2022-08-14: qty 1

## 2022-08-14 MED ORDER — SODIUM CHLORIDE (PF) 0.9 % IJ SOLN
INTRAMUSCULAR | Status: DC | PRN
  Administered 2022-08-14: 30 mL

## 2022-08-14 MED ORDER — ISOPROPYL ALCOHOL 70 % SOLN
Status: DC | PRN
  Administered 2022-08-14: 1 via TOPICAL

## 2022-08-14 MED ORDER — POLYETHYLENE GLYCOL 3350 17 G PO PACK: 17.0000 g | PACK | Freq: Every day | ORAL | Status: DC | PRN

## 2022-08-14 MED ORDER — OXYCODONE HCL 5 MG PO TABS: 5.0000 mg | ORAL_TABLET | Freq: Once | ORAL | Status: DC | PRN

## 2022-08-14 MED ORDER — BUPIVACAINE-EPINEPHRINE 0.25% -1:200000 IJ SOLN
INTRAMUSCULAR | Status: DC | PRN
  Administered 2022-08-14: 30 mL

## 2022-08-14 MED ORDER — POVIDONE-IODINE 10 % EX SWAB: 2.0000 | Freq: Once | CUTANEOUS | Status: DC

## 2022-08-14 MED ORDER — LACTATED RINGERS IV SOLN: INTRAVENOUS | Status: DC

## 2022-08-14 MED ORDER — EPINEPHRINE PF 1 MG/ML IJ SOLN
INTRAMUSCULAR | Status: AC
  Filled 2022-08-14: qty 1

## 2022-08-14 MED ORDER — METHOCARBAMOL 500 MG PO TABS
500.0000 mg | ORAL_TABLET | Freq: Four times a day (QID) | ORAL | Status: DC | PRN
  Administered 2022-08-14 – 2022-08-15 (×2): 500 mg via ORAL
  Filled 2022-08-14 (×2): qty 1

## 2022-08-14 MED ORDER — PROPOFOL 500 MG/50ML IV EMUL
INTRAVENOUS | Status: DC | PRN
  Administered 2022-08-14: 40 ug/kg/min via INTRAVENOUS

## 2022-08-14 MED ORDER — ACETAMINOPHEN 10 MG/ML IV SOLN: 1000.0000 mg | Freq: Once | INTRAVENOUS | Status: DC | PRN

## 2022-08-14 MED ORDER — METOCLOPRAMIDE HCL 5 MG PO TABS: 5.0000 mg | ORAL_TABLET | Freq: Three times a day (TID) | ORAL | Status: DC | PRN

## 2022-08-14 MED ORDER — PROPOFOL 1000 MG/100ML IV EMUL
INTRAVENOUS | Status: AC
  Filled 2022-08-14: qty 100

## 2022-08-14 MED ORDER — ACETAMINOPHEN 500 MG PO TABS: 1000.0000 mg | ORAL_TABLET | Freq: Once | ORAL | Status: DC

## 2022-08-14 MED ORDER — MORPHINE SULFATE (PF) 2 MG/ML IV SOLN: 0.5000 mg | INTRAVENOUS | Status: DC | PRN

## 2022-08-14 MED ORDER — CHLORHEXIDINE GLUCONATE 0.12 % MT SOLN
15.0000 mL | Freq: Once | OROMUCOSAL | Status: AC
  Administered 2022-08-14: 15 mL via OROMUCOSAL

## 2022-08-14 MED ORDER — MIDAZOLAM HCL 5 MG/5ML IJ SOLN
INTRAMUSCULAR | Status: DC | PRN
  Administered 2022-08-14: 2 mg via INTRAVENOUS

## 2022-08-14 MED ORDER — BUPIVACAINE IN DEXTROSE 0.75-8.25 % IT SOLN
INTRATHECAL | Status: DC | PRN
  Administered 2022-08-14: 1.7 mL via INTRATHECAL

## 2022-08-14 MED ORDER — SODIUM CHLORIDE 0.9 % IR SOLN
Status: DC | PRN
  Administered 2022-08-14: 3000 mL
  Administered 2022-08-14: 1000 mL

## 2022-08-14 MED ORDER — SENNA 8.6 MG PO TABS
1.0000 | ORAL_TABLET | Freq: Two times a day (BID) | ORAL | Status: DC
  Administered 2022-08-14 – 2022-08-15 (×2): 8.6 mg via ORAL
  Filled 2022-08-14 (×3): qty 1

## 2022-08-14 MED ORDER — CEFAZOLIN SODIUM-DEXTROSE 2-4 GM/100ML-% IV SOLN
2.0000 g | INTRAVENOUS | Status: AC
  Administered 2022-08-14: 2 g via INTRAVENOUS
  Filled 2022-08-14: qty 100

## 2022-08-14 MED ORDER — DIPHENHYDRAMINE HCL 12.5 MG/5ML PO ELIX: 12.5000 mg | ORAL_SOLUTION | ORAL | Status: DC | PRN

## 2022-08-14 MED ORDER — ALUM & MAG HYDROXIDE-SIMETH 200-200-20 MG/5ML PO SUSP: 30.0000 mL | ORAL | Status: DC | PRN

## 2022-08-14 MED ORDER — ASPIRIN 81 MG PO CHEW
81.0000 mg | CHEWABLE_TABLET | Freq: Two times a day (BID) | ORAL | Status: DC
  Administered 2022-08-14 – 2022-08-15 (×2): 81 mg via ORAL
  Filled 2022-08-14 (×2): qty 1

## 2022-08-14 MED ORDER — ACETAMINOPHEN 10 MG/ML IV SOLN
INTRAVENOUS | Status: DC | PRN
  Administered 2022-08-14: 1000 mg via INTRAVENOUS

## 2022-08-14 MED ORDER — ONDANSETRON HCL 4 MG/2ML IJ SOLN: 4.0000 mg | Freq: Four times a day (QID) | INTRAMUSCULAR | Status: DC | PRN

## 2022-08-14 MED ORDER — BUPIVACAINE HCL 0.25 % IJ SOLN
INTRAMUSCULAR | Status: AC
  Filled 2022-08-14: qty 1

## 2022-08-14 MED ORDER — CEFAZOLIN SODIUM-DEXTROSE 2-4 GM/100ML-% IV SOLN
2.0000 g | Freq: Four times a day (QID) | INTRAVENOUS | Status: AC
  Administered 2022-08-14 (×2): 2 g via INTRAVENOUS
  Filled 2022-08-14 (×2): qty 100

## 2022-08-14 MED ORDER — PANTOPRAZOLE SODIUM 40 MG PO TBEC
80.0000 mg | DELAYED_RELEASE_TABLET | Freq: Every day | ORAL | Status: DC
  Administered 2022-08-15: 80 mg via ORAL
  Filled 2022-08-14: qty 2

## 2022-08-14 MED ORDER — METHOCARBAMOL 500 MG IVPB - SIMPLE MED: 500.0000 mg | Freq: Four times a day (QID) | INTRAVENOUS | Status: DC | PRN

## 2022-08-14 MED ORDER — POVIDONE-IODINE 10 % EX SWAB
2.0000 | Freq: Once | CUTANEOUS | Status: AC
  Administered 2022-08-14: 2 via TOPICAL

## 2022-08-14 MED ORDER — GLYCOPYRROLATE 0.2 MG/ML IJ SOLN
INTRAMUSCULAR | Status: DC | PRN
  Administered 2022-08-14 (×2): .1 mg via INTRAVENOUS

## 2022-08-14 MED ORDER — PHENYLEPHRINE HCL-NACL 20-0.9 MG/250ML-% IV SOLN
INTRAVENOUS | Status: DC | PRN
  Administered 2022-08-14: 20 ug/min via INTRAVENOUS

## 2022-08-14 MED ORDER — SODIUM CHLORIDE (PF) 0.9 % IJ SOLN
INTRAMUSCULAR | Status: AC
  Filled 2022-08-14: qty 30

## 2022-08-14 SURGICAL SUPPLY — 56 items
ACE SHELL 3H 52 E HIP (Shell) ×1 IMPLANT
ADH SKN CLS APL DERMABOND .7 (GAUZE/BANDAGES/DRESSINGS) ×1
APL PRP STRL LF DISP 70% ISPRP (MISCELLANEOUS) ×1
BAG COUNTER SPONGE SURGICOUNT (BAG) IMPLANT
BAG DECANTER FOR FLEXI CONT (MISCELLANEOUS) IMPLANT
BAG SPEC THK2 15X12 ZIP CLS (MISCELLANEOUS)
BAG SPNG CNTER NS LX DISP (BAG)
BAG ZIPLOCK 12X15 (MISCELLANEOUS) IMPLANT
CHLORAPREP W/TINT 26 (MISCELLANEOUS) ×2 IMPLANT
COVER PERINEAL POST (MISCELLANEOUS) ×2 IMPLANT
COVER SURGICAL LIGHT HANDLE (MISCELLANEOUS) ×2 IMPLANT
DERMABOND ADVANCED .7 DNX12 (GAUZE/BANDAGES/DRESSINGS) ×4 IMPLANT
DRAPE IMP U-DRAPE 54X76 (DRAPES) ×2 IMPLANT
DRAPE SHEET LG 3/4 BI-LAMINATE (DRAPES) ×6 IMPLANT
DRAPE STERI IOBAN 125X83 (DRAPES) ×2 IMPLANT
DRAPE U-SHAPE 47X51 STRL (DRAPES) ×2 IMPLANT
DRSG AQUACEL AG ADV 3.5X10 (GAUZE/BANDAGES/DRESSINGS) ×2 IMPLANT
ELECT REM PT RETURN 15FT ADLT (MISCELLANEOUS) ×2 IMPLANT
GAUZE SPONGE 4X4 12PLY STRL (GAUZE/BANDAGES/DRESSINGS) ×2 IMPLANT
GLOVE BIO SURGEON STRL SZ7 (GLOVE) ×2 IMPLANT
GLOVE BIO SURGEON STRL SZ8.5 (GLOVE) ×4 IMPLANT
GLOVE BIOGEL PI IND STRL 7.5 (GLOVE) ×2 IMPLANT
GLOVE BIOGEL PI IND STRL 8.5 (GLOVE) ×2 IMPLANT
GOWN SPEC L3 XXLG W/TWL (GOWN DISPOSABLE) ×2 IMPLANT
GOWN STRL REUS W/ TWL XL LVL3 (GOWN DISPOSABLE) ×2 IMPLANT
GOWN STRL REUS W/TWL XL LVL3 (GOWN DISPOSABLE) ×1
HANDPIECE INTERPULSE COAX TIP (DISPOSABLE) ×1
HEAD CERAMIC BIOLOX 36 (Head) IMPLANT
HOLDER FOLEY CATH W/STRAP (MISCELLANEOUS) ×2 IMPLANT
HOOD PEEL AWAY T7 (MISCELLANEOUS) ×6 IMPLANT
KIT TURNOVER KIT A (KITS) IMPLANT
LINER ACE G7 HIGH 36 SZ E (Liner) IMPLANT
MANIFOLD NEPTUNE II (INSTRUMENTS) ×2 IMPLANT
MARKER SKIN DUAL TIP RULER LAB (MISCELLANEOUS) ×2 IMPLANT
NDL SAFETY ECLIP 18X1.5 (MISCELLANEOUS) ×2 IMPLANT
NDL SPNL 18GX3.5 QUINCKE PK (NEEDLE) ×2 IMPLANT
NEEDLE SPNL 18GX3.5 QUINCKE PK (NEEDLE) ×1 IMPLANT
PACK ANTERIOR HIP CUSTOM (KITS) ×2 IMPLANT
SAW OSC TIP CART 19.5X105X1.3 (SAW) ×2 IMPLANT
SEALER BIPOLAR AQUA 6.0 (INSTRUMENTS) ×2 IMPLANT
SET HNDPC FAN SPRY TIP SCT (DISPOSABLE) ×2 IMPLANT
SHELL ACETAB 3H 52 E HIP (Shell) IMPLANT
SOLUTION PRONTOSAN WOUND 350ML (IRRIGATION / IRRIGATOR) ×2 IMPLANT
SPIKE FLUID TRANSFER (MISCELLANEOUS) ×2 IMPLANT
STEM FEM CMTLS STD HIP 12 133D (Stem) IMPLANT
SUT MNCRL AB 3-0 PS2 18 (SUTURE) ×2 IMPLANT
SUT MON AB 2-0 CT1 36 (SUTURE) ×2 IMPLANT
SUT STRATAFIX PDO 1 14 VIOLET (SUTURE) ×1
SUT STRATFX PDO 1 14 VIOLET (SUTURE) ×1
SUT VIC AB 2-0 CT1 27 (SUTURE)
SUT VIC AB 2-0 CT1 TAPERPNT 27 (SUTURE) IMPLANT
SUTURE STRATFX PDO 1 14 VIOLET (SUTURE) ×2 IMPLANT
SYR 3ML LL SCALE MARK (SYRINGE) ×2 IMPLANT
TRAY FOLEY MTR SLVR 16FR STAT (SET/KITS/TRAYS/PACK) IMPLANT
TUBE SUCTION HIGH CAP CLEAR NV (SUCTIONS) ×2 IMPLANT
WATER STERILE IRR 1000ML POUR (IV SOLUTION) ×2 IMPLANT

## 2022-08-14 NOTE — Anesthesia Procedure Notes (Signed)
Spinal  Patient location during procedure: OR Start time: 08/14/2022 1:09 PM End time: 08/14/2022 12:47 PM Reason for block: surgical anesthesia Staffing Anesthesiologist: Trevor Iha, MD Performed by: Trevor Iha, MD Authorized by: Trevor Iha, MD   Preanesthetic Checklist Completed: patient identified, IV checked, site marked, risks and benefits discussed, surgical consent, monitors and equipment checked, pre-op evaluation and timeout performed Spinal Block Patient position: sitting Prep: DuraPrep Patient monitoring: heart rate, cardiac monitor, continuous pulse ox and blood pressure Approach: midline Location: L3-4 Injection technique: single-shot Needle Needle type: Sprotte  Needle gauge: 24 G Needle length: 9 cm Needle insertion depth: 8 cm Assessment Sensory level: T4 Events: CSF return and second provider Additional Notes 2 attempts after 2 attempts by fist provider at L3 4

## 2022-08-14 NOTE — Anesthesia Preprocedure Evaluation (Addendum)
Anesthesia Evaluation  Patient identified by MRN, date of birth, ID band Patient awake    Reviewed: Allergy & Precautions, NPO status , Patient's Chart, lab work & pertinent test results  Airway Mallampati: II  TM Distance: >3 FB Neck ROM: Full    Dental no notable dental hx. (+) Teeth Intact, Dental Advisory Given   Pulmonary sleep apnea and Continuous Positive Airway Pressure Ventilation    Pulmonary exam normal breath sounds clear to auscultation       Cardiovascular hypertension, Pt. on medications Normal cardiovascular exam Rhythm:Regular Rate:Normal     Neuro/Psych negative neurological ROS  negative psych ROS   GI/Hepatic ,GERD  Medicated and Controlled,,  Endo/Other    Renal/GU Lab Results      Component                Value               Date                      CREATININE               0.84                08/04/2022                BUN                      12                  08/04/2022                NA                       138                 08/04/2022                K                        3.7                 08/04/2022                CL                       102                 08/04/2022                CO2                      27                  08/04/2022                Musculoskeletal  (+) Arthritis ,    Abdominal   Peds  Hematology Lab Results      Component                Value               Date                      WBC  5.3                 08/04/2022                HGB                      12.5                08/04/2022                HCT                      39.1                08/04/2022                MCV                      89.9                08/04/2022                PLT                      259                 08/04/2022              Anesthesia Other Findings All: PCN  Reproductive/Obstetrics                             Anesthesia  Physical Anesthesia Plan  ASA: 2  Anesthesia Plan: Spinal   Post-op Pain Management: Regional block* and Minimal or no pain anticipated   Induction:   PONV Risk Score and Plan: Treatment may vary due to age or medical condition, Midazolam and Ondansetron  Airway Management Planned: Nasal Cannula and Simple Face Mask  Additional Equipment: None  Intra-op Plan:   Post-operative Plan:   Informed Consent: I have reviewed the patients History and Physical, chart, labs and discussed the procedure including the risks, benefits and alternatives for the proposed anesthesia with the patient or authorized representative who has indicated his/her understanding and acceptance.     Dental advisory given  Plan Discussed with:   Anesthesia Plan Comments:         Anesthesia Quick Evaluation

## 2022-08-14 NOTE — Op Note (Signed)
OPERATIVE REPORT  SURGEON: Samson Frederic, MD   ASSISTANT: Clint Bolder, PA-C.  PREOPERATIVE DIAGNOSIS: Left hip arthritis.   POSTOPERATIVE DIAGNOSIS: Left hip arthritis.   PROCEDURE: Left total hip arthroplasty, anterior approach.   IMPLANTS: Biomet Taperloc Complete Microplasty stem, size 12 x 109 mm, standard offset. Biomet G7 OsseoTi Cup, size 52 mm. Biomet Vivacit-E liner, size 36 mm, E, neutral. Biomet Biolox ceramic head ball, size 36 - 3 mm.  ANESTHESIA:  MAC and Spinal  ESTIMATED BLOOD LOSS:-500 mL    ANTIBIOTICS: 2g Ancef.  DRAINS: None.  COMPLICATIONS: None.   CONDITION: PACU - hemodynamically stable.   BRIEF CLINICAL NOTE: Casey Newton is a 67 y.o. female with a long-standing history of Left hip arthritis. After failing conservative management, the patient was indicated for total hip arthroplasty. The risks, benefits, and alternatives to the procedure were explained, and the patient elected to proceed.  PROCEDURE IN DETAIL: Surgical site was marked by myself in the pre-op holding area. Once inside the operating room, spinal anesthesia was obtained, and a foley catheter was inserted. The patient was then positioned on the Hana table.  All bony prominences were well padded.  The hip was prepped and draped in the normal sterile surgical fashion.  A time-out was called verifying side and site of surgery. The patient received IV antibiotics within 60 minutes of beginning the procedure.   Bikini incision was made, and superficial dissection was performed lateral to the ASIS. The direct anterior approach to the hip was performed through the Hueter interval.  Lateral femoral circumflex vessels were treated with the Auqumantys. The anterior capsule was exposed and an inverted T capsulotomy was made. The femoral neck cut was made to the level of the templated cut.  A corkscrew was placed into the head and the head was removed.  The femoral head was found to have  eburnated bone. The head was passed to the back table and was measured. Pubofemoral ligament was released off of the calcar, taking care to stay on bone. Superior capsule was released from the greater trochanter, taking care to stay lateral to the posterior border of the femoral neck in order to preserve the short external rotators.   Acetabular exposure was achieved, and the pulvinar and labrum were excised. Sequential reaming of the acetabulum was then performed up to a size 51 mm reamer. A 52 mm cup was then opened and impacted into place at approximately 40 degrees of abduction and 20 degrees of anteversion. The final polyethylene liner was impacted into place and acetabular osteophytes were removed.    I then gained femoral exposure taking care to protect the abductors and greater trochanter.  This was performed using standard external rotation, extension, and adduction.  A cookie cutter was used to enter the femoral canal, and then the femoral canal finder was placed.  Sequential broaching was performed up to a size 12.  Calcar planer was used on the femoral neck remnant.  I placed a std offset neck and a trial head ball.  The hip was reduced.  Leg lengths and offset were checked fluoroscopically.  The hip was dislocated and trial components were removed.  The final implants were placed, and the hip was reduced.  Fluoroscopy was used to confirm component position and leg lengths.  At 90 degrees of external rotation and full extension, the hip was stable to an anterior directed force.   The wound was copiously irrigated with Prontosan solution and normal saline using pulse lavage.  Marcaine solution was injected into the periarticular soft tissue.  The wound was closed in layers using #1 Vicryl and V-Loc for the fascia, 2-0 Vicryl for the subcutaneous fat, 2-0 Monocryl for the deep dermal layer, 3-0 running Monocryl subcuticular stitch, and Dermabond for the skin.  Once the glue was fully dried, an  Aquacell Ag dressing was applied.  The patient was transported to the recovery room in stable condition.  Sponge, needle, and instrument counts were correct at the end of the case x2.  The patient tolerated the procedure well and there were no known complications.  The aquamantis was utilized for this case to help facilitate better hemostasis as patient was felt to be at increased risk of bleeding because of complex case requiring increased OR time and/or exposure.  A oscillating saw tip was utilized for this case to prevent damage to the soft tissue structures such as muscles, ligaments and tendons, and to ensure accurate bone cuts. This patient was at increased risk for above structures due to  minimally invasive approach.  Please note that a surgical assistant was a medical necessity for this procedure to perform it in a safe and expeditious manner. Assistant was necessary to provide appropriate retraction of vital neurovascular structures, to prevent femoral fracture, and to allow for anatomic placement of the prosthesis.

## 2022-08-14 NOTE — Discharge Instructions (Signed)
? ?Dr. Brian Swinteck ?Joint Replacement Specialist ?[facility] ?[address] ?[place], [place] [address] ?[phone] ? ? ?TOTAL HIP REPLACEMENT POSTOPERATIVE DIRECTIONS ? ? ? ?Hip Rehabilitation, Guidelines Following Surgery  ? ?WEIGHT BEARING ?Weight bearing as tolerated with assist device (walker, cane, etc) as directed, use it as long as suggested by your surgeon or therapist, typically at least 4-6 weeks. ? ?The results of a hip operation are greatly improved after range of motion and muscle strengthening exercises. Follow all safety measures which are given to protect your hip. If any of these exercises cause increased pain or swelling in your joint, decrease the amount until you are comfortable again. Then slowly increase the exercises. Call your caregiver if you have problems or questions.  ? ?HOME CARE INSTRUCTIONS  ?Most of the following instructions are designed to prevent the dislocation of your new hip.  ?Remove items at home which could result in a fall. This includes throw rugs or furniture in walking pathways.  ?Continue medications as instructed at time of discharge. ?You may have some home medications which will be placed on hold until you complete the course of blood thinner medication. ?You may start showering once you are discharged home. Do not remove your dressing. ?Do not put on socks or shoes without following the instructions of your caregivers.   ?Sit on chairs with arms. Use the chair arms to help push yourself up when arising.  ?Arrange for the use of a toilet seat elevator so you are not sitting low.  ?Walk with walker as instructed.  ?You may resume a sexual relationship in one month or when given the OK by your caregiver.  ?Use walker as long as suggested by your caregivers.  ?You may put full weight on your legs and walk as much as is comfortable. ?Avoid periods of inactivity such as sitting longer than an hour when not asleep. This helps prevent blood  clots.  ?You may return to work once you are cleared by your surgeon.  ?Do not drive a car for 6 weeks or until released by your surgeon.  ?Do not drive while taking narcotics.  ?Wear elastic stockings for two weeks following surgery during the day but you may remove then at night.  ?Make sure you keep all of your appointments after your operation with all of your doctors and caregivers. You should call the office at the above phone number and make an appointment for approximately two weeks after the date of your surgery. ?Please pick up a stool softener and laxative for home use as long as you are requiring pain medications. ?ICE to the affected hip every three hours for 30 minutes at a time and then as needed for pain and swelling. Continue to use ice on the hip for pain and swelling from surgery. You may notice swelling that will progress down to the foot and ankle.  This is normal after surgery.  Elevate the leg when you are not up walking on it.   ?It is important for you to complete the blood thinner medication as prescribed by your doctor. ?Continue to use the breathing machine which will help keep your temperature down.  It is common for your temperature to cycle up and down following surgery, especially at night when you are not up moving around and exerting yourself.  The breathing machine keeps your lungs expanded and your temperature down. ? ?RANGE OF MOTION AND STRENGTHENING EXERCISES  ?These exercises are designed to help you   keep full movement of your hip joint. Follow your caregiver's or physical therapist's instructions. Perform all exercises about fifteen times, three times per day or as directed. Exercise both hips, even if you have had only one joint replacement. These exercises can be done on a training (exercise) mat, on the floor, on a table or on a bed. Use whatever works the best and is most comfortable for you. Use music or television while you are exercising so that the exercises are a  pleasant break in your day. This will make your life better with the exercises acting as a break in routine you can look forward to.  ?Lying on your back, slowly slide your foot toward your buttocks, raising your knee up off the floor. Then slowly slide your foot back down until your leg is straight again.  ?Lying on your back spread your legs as far apart as you can without causing discomfort.  ?Lying on your side, raise your upper leg and foot straight up from the floor as far as is comfortable. Slowly lower the leg and repeat.  ?Lying on your back, tighten up the muscle in the front of your thigh (quadriceps muscles). You can do this by keeping your leg straight and trying to raise your heel off the floor. This helps strengthen the largest muscle supporting your knee.  ?Lying on your back, tighten up the muscles of your buttocks both with the legs straight and with the knee bent at a comfortable angle while keeping your heel on the floor.  ? ?SKILLED REHAB INSTRUCTIONS: ?If the patient is transferred to a skilled rehab facility following release from the hospital, a list of the current medications will be sent to the facility for the patient to continue.  When discharged from the skilled rehab facility, please have the facility set up the patient's Home Health Physical Therapy prior to being released. Also, the skilled facility will be responsible for providing the patient with their medications at time of release from the facility to include their pain medication and their blood thinner medication. If the patient is still at the rehab facility at time of the two week follow up appointment, the skilled rehab facility will also need to assist the patient in arranging follow up appointment in our office and any transportation needs. ? ?POST-OPERATIVE OPIOID TAPER INSTRUCTIONS: ?It is important to wean off of your opioid medication as soon as possible. If you do not need pain medication after your surgery it is ok  to stop day one. ?Opioids include: ?Codeine, Hydrocodone(Norco, Vicodin), Oxycodone(Percocet, oxycontin) and hydromorphone amongst others.  ?Long term and even short term use of opiods can cause: ?Increased pain response ?Dependence ?Constipation ?Depression ?Respiratory depression ?And more.  ?Withdrawal symptoms can include ?Flu like symptoms ?Nausea, vomiting ?And more ?Techniques to manage these symptoms ?Hydrate well ?Eat regular healthy meals ?Stay active ?Use relaxation techniques(deep breathing, meditating, yoga) ?Do Not substitute Alcohol to help with tapering ?If you have been on opioids for less than two weeks and do not have pain than it is ok to stop all together.  ?Plan to wean off of opioids ?This plan should start within one week post op of your joint replacement. ?Maintain the same interval or time between taking each dose and first decrease the dose.  ?Cut the total daily intake of opioids by one tablet each day ?Next start to increase the time between doses. ?The last dose that should be eliminated is the evening dose.  ? ? ?MAKE   SURE YOU:  ?Understand these instructions.  ?Will watch your condition.  ?Will get help right away if you are not doing well or get worse. ? ?Pick up stool softner and laxative for home use following surgery while on pain medications. ?Do not remove your dressing. ?The dressing is waterproof--it is OK to take showers. ?Continue to use ice for pain and swelling after surgery. ?Do not use any lotions or creams on the incision until instructed by your surgeon. ?Total Hip Protocol. ? ?

## 2022-08-14 NOTE — Transfer of Care (Signed)
Immediate Anesthesia Transfer of Care Note  Patient: Casey Newton  Procedure(s) Performed: TOTAL HIP ARTHROPLASTY ANTERIOR APPROACH (Left: Hip)  Patient Location: PACU  Anesthesia Type:Spinal  Level of Consciousness: drowsy and patient cooperative  Airway & Oxygen Therapy: Patient Spontanous Breathing and Patient connected to face mask oxygen  Post-op Assessment: Report given to RN and Post -op Vital signs reviewed and stable  Post vital signs: Reviewed and stable  Last Vitals:  Vitals Value Taken Time  BP 125/70 08/14/22 1453  Temp    Pulse 78 08/14/22 1456  Resp 21 08/14/22 1456  SpO2 98 % 08/14/22 1456  Vitals shown include unvalidated device data.  Last Pain:  Vitals:   08/14/22 1110  TempSrc:   PainSc: 0-No pain         Complications: No notable events documented.

## 2022-08-14 NOTE — Progress Notes (Signed)
   08/14/22 2228  BiPAP/CPAP/SIPAP  $ Non-Invasive Home Ventilator  Initial  BiPAP/CPAP/SIPAP Pt Type Adult  BiPAP/CPAP/SIPAP DREAMSTATIOND  Mask Type Full face mask  Respiratory Rate 18 breaths/min  FiO2 (%) 21 %  Patient Home Equipment Yes (Mask and hose)  Auto Titrate Yes (7-20)  CPAP/SIPAP surface wiped down Yes  Safety Check Completed by RT for Home Unit Yes, no issues noted  BiPAP/CPAP /SiPAP Vitals  Pulse Rate 64  Resp 18  SpO2 98 %  Bilateral Breath Sounds Clear  MEWS Score/Color  MEWS Score 0  MEWS Score Color Chilton Si

## 2022-08-14 NOTE — Anesthesia Postprocedure Evaluation (Signed)
Anesthesia Post Note  Patient: Casey Newton  Procedure(s) Performed: TOTAL HIP ARTHROPLASTY ANTERIOR APPROACH (Left: Hip)     Patient location during evaluation: Nursing Unit Anesthesia Type: Spinal Level of consciousness: oriented and awake and alert Pain management: pain level controlled Vital Signs Assessment: post-procedure vital signs reviewed and stable Respiratory status: spontaneous breathing and respiratory function stable Cardiovascular status: blood pressure returned to baseline and stable Postop Assessment: no headache, no backache, no apparent nausea or vomiting and patient able to bend at knees Anesthetic complications: no   No notable events documented.  Last Vitals:  Vitals:   08/14/22 1545 08/14/22 1600  BP: (!) 159/79   Pulse: 63 (!) 46  Resp: 19 15  Temp: 36.9 C   SpO2: 93% 95%    Last Pain:  Vitals:   08/14/22 1600  TempSrc:   PainSc: 0-No pain            L Sensory Level: S1-Sole of foot, small toes (08/14/22 1600) R Sensory Level: S1-Sole of foot, small toes (08/14/22 1600)  Trevor Iha

## 2022-08-14 NOTE — Interval H&P Note (Signed)
History and Physical Interval Note:  08/14/2022 11:02 AM  Casey Newton  has presented today for surgery, with the diagnosis of Left hip osteoarthrtiis.  The various methods of treatment have been discussed with the patient and family. After consideration of risks, benefits and other options for treatment, the patient has consented to  Procedure(s) with comments: TOTAL HIP ARTHROPLASTY ANTERIOR APPROACH (Left) - 150 as a surgical intervention.  The patient's history has been reviewed, patient examined, no change in status, stable for surgery.  I have reviewed the patient's chart and labs.  Questions were answered to the patient's satisfaction.     Iline Oven Emelyn Roen

## 2022-08-14 NOTE — Anesthesia Procedure Notes (Signed)
Procedure Name: MAC Date/Time: 08/14/2022 12:36 PM  Performed by: Elisabeth Cara, CRNAPre-anesthesia Checklist: Patient identified, Emergency Drugs available, Suction available, Patient being monitored and Timeout performed Patient Re-evaluated:Patient Re-evaluated prior to induction Oxygen Delivery Method: Simple face mask Placement Confirmation: positive ETCO2 Dental Injury: Teeth and Oropharynx as per pre-operative assessment

## 2022-08-15 ENCOUNTER — Encounter (HOSPITAL_COMMUNITY): Payer: Self-pay | Admitting: Orthopedic Surgery

## 2022-08-15 DIAGNOSIS — M1612 Unilateral primary osteoarthritis, left hip: Secondary | ICD-10-CM | POA: Diagnosis not present

## 2022-08-15 LAB — CBC
HCT: 29 % — ABNORMAL LOW (ref 36.0–46.0)
Hemoglobin: 9.3 g/dL — ABNORMAL LOW (ref 12.0–15.0)
MCH: 29 pg (ref 26.0–34.0)
MCHC: 32.1 g/dL (ref 30.0–36.0)
MCV: 90.3 fL (ref 80.0–100.0)
Platelets: 208 10*3/uL (ref 150–400)
RBC: 3.21 MIL/uL — ABNORMAL LOW (ref 3.87–5.11)
RDW: 14 % (ref 11.5–15.5)
WBC: 10.8 10*3/uL — ABNORMAL HIGH (ref 4.0–10.5)
nRBC: 0 % (ref 0.0–0.2)

## 2022-08-15 LAB — BASIC METABOLIC PANEL
Anion gap: 7 (ref 5–15)
BUN: 9 mg/dL (ref 8–23)
CO2: 25 mmol/L (ref 22–32)
Calcium: 8 mg/dL — ABNORMAL LOW (ref 8.9–10.3)
Chloride: 105 mmol/L (ref 98–111)
Creatinine, Ser: 0.83 mg/dL (ref 0.44–1.00)
GFR, Estimated: >60 mL/min (ref >60)
Glucose, Bld: 142 mg/dL — ABNORMAL HIGH (ref 70–99)
Potassium: 3.8 mmol/L (ref 3.5–5.1)
Sodium: 137 mmol/L (ref 135–145)

## 2022-08-15 MED ORDER — SENNA 8.6 MG PO TABS: 2.0000 | ORAL_TABLET | Freq: Every day | ORAL | 0 refills | Status: AC

## 2022-08-15 MED ORDER — POLYETHYLENE GLYCOL 3350 17 G PO PACK: 17.0000 g | PACK | Freq: Every day | ORAL | 0 refills | Status: AC | PRN

## 2022-08-15 MED ORDER — ONDANSETRON HCL 4 MG PO TABS: 4.0000 mg | ORAL_TABLET | Freq: Three times a day (TID) | ORAL | 0 refills | Status: AC | PRN

## 2022-08-15 MED ORDER — ASPIRIN 81 MG PO CHEW: 81.0000 mg | CHEWABLE_TABLET | Freq: Two times a day (BID) | ORAL | 0 refills | Status: AC

## 2022-08-15 MED ORDER — HYDROCODONE-ACETAMINOPHEN 5-325 MG PO TABS: 1.0000 | ORAL_TABLET | ORAL | 0 refills | Status: AC | PRN

## 2022-08-15 MED ORDER — DOCUSATE SODIUM 100 MG PO CAPS: 100.0000 mg | ORAL_CAPSULE | Freq: Two times a day (BID) | ORAL | 0 refills | Status: AC

## 2022-08-15 NOTE — Progress Notes (Signed)
Physical Therapy Treatment Patient Details Name: Casey Newton MRN: [id number] DOB: 23-Oct-1955 Today's Date: 08/15/2022   History of Present Illness Pt s/p L THR    PT Comments    Nursing requesting PT return to room to address pt questions.  Pt reviewed bed mobility tasks including rolling on side to sleep and LB dressing techniques.  Dtr in room.  Pt eager to dc home this date.  Recommendations for follow up therapy are one component of a multi-disciplinary discharge planning process, led by the attending physician.  Recommendations may be updated based on patient status, additional functional criteria and insurance authorization.  Follow Up Recommendations       Assistance Recommended at Discharge PRN  Patient can return home with the following     Equipment Recommendations  Rolling walker (2 wheels)    Recommendations for Other Services       Precautions / Restrictions Precautions Precautions: Fall Restrictions Weight Bearing Restrictions: No     Mobility  Bed Mobility Overal bed mobility: Needs Assistance Bed Mobility: Supine to Sit, Sit to Supine, Rolling Rolling: Min guard   Supine to sit: Min guard Sit to supine: Min guard   General bed mobility comments: Increased time with cues for sequence, posture and position from RW    Transfers Overall transfer level: Needs assistance Equipment used: Rolling walker (2 wheels) Transfers: Sit to/from Stand Sit to Stand: Supervision           General transfer comment: pt self-cues for LE management and use of UEs to self assist    Ambulation/Gait Ambulation/Gait assistance: Min guard, Supervision Gait Distance (Feet): 24 Feet Assistive device: Rolling walker (2 wheels) Gait Pattern/deviations: Step-to pattern, Decreased step length - right, Decreased step length - left, Shuffle, Trunk flexed Gait velocity: decr     General Gait Details: cues for sequence, posture and position from  RW   Stairs Stairs: Yes Stairs assistance: Min assist Stair Management: One rail Left, Step to pattern, Forwards, Sideways, With cane Number of Stairs: 7 General stair comments: 2 stairs sideways and 5 stairs with rail and cane; cues for sequence   Wheelchair Mobility    Modified Rankin (Stroke Patients Only)       Balance Overall balance assessment: Mild deficits observed, not formally tested                                          Cognition Arousal/Alertness: Awake/alert Behavior During Therapy: WFL for tasks assessed/performed Overall Cognitive Status: Within Functional Limits for tasks assessed                                          Exercises Total Joint Exercises Ankle Circles/Pumps: AROM, Both, 15 reps, Supine Quad Sets: AROM, Both, 10 reps, Supine Heel Slides: AAROM, Left, 20 reps, Supine Hip ABduction/ADduction: AAROM, Left, 15 reps, Supine Long Arc Quad: AAROM, Left, 10 reps, Seated    General Comments        Pertinent Vitals/Pain Pain Assessment Pain Assessment: 0-10 Pain Score: 4  Pain Location: L hip Pain Descriptors / Indicators: Aching, Sore Pain Intervention(s): Limited activity within patient's tolerance, Monitored during session, Premedicated before session    Home Living Family/patient expects to be discharged to:: Private residence Living Arrangements: Spouse/significant other Available Help at Discharge:  Family Type of Home: House Home Access: Stairs to enter Entrance Stairs-Rails: Left Entrance Stairs-Number of Steps: 3   Home Layout: One level Home Equipment: Cane - single point      Prior Function            PT Goals (current goals can now be found in the care plan section) Acute Rehab PT Goals Patient Stated Goal: regain IND PT Goal Formulation: With patient Time For Goal Achievement: 08/22/22 Potential to Achieve Goals: Good Progress towards PT goals: Progressing toward goals     Frequency    7X/week      PT Plan Current plan remains appropriate    Co-evaluation              AM-PAC PT "6 Clicks" Mobility   Outcome Measure  Help needed turning from your back to your side while in a flat bed without using bedrails?: A Little Help needed moving from lying on your back to sitting on the side of a flat bed without using bedrails?: A Little Help needed moving to and from a bed to a chair (including a wheelchair)?: A Little Help needed standing up from a chair using your arms (e.g., wheelchair or bedside chair)?: A Little Help needed to walk in hospital room?: A Little Help needed climbing 3-5 steps with a railing? : A Little 6 Click Score: 18    End of Session Equipment Utilized During Treatment: Gait belt Activity Tolerance: Patient tolerated treatment well Patient left: in chair;with call bell/phone within reach;with chair alarm set;with nursing/sitter in room Nurse Communication: Mobility status PT Visit Diagnosis: Difficulty in walking, not elsewhere classified (R26.2)     Time: 1350-1403 PT Time Calculation (min) (ACUTE ONLY): 13 min  Charges:  $Gait Training: 8-22 mins $Therapeutic Exercise: 8-22 mins $Therapeutic Activity: 8-22 mins                     Mauro Kaufmann PT Acute Rehabilitation Services Pager [phone] Office [phone]    Zion Ta 08/15/2022, 2:13 PM

## 2022-08-15 NOTE — TOC Transition Note (Signed)
Transition of Care [facility]) - CM/SW Discharge Note  Patient Details  Name: Casey Newton MRN: [id number] Date of Birth: [birth year]  Transition of Care [facility]) CM/SW Contact:  Ewing Schlein, LCSW Phone Number: 08/15/2022, 10:45 AM  Clinical Narrative: Patient is expected to discharge home after working with PT. CSW met with patient to confirm discharge plan and needs. Patient will go home with a home exercise program (HEP). Patient will need a youth rolling walker, which [employer] delivered to patient's room. TOC signing off.  Final next level of care: Home/Self Care Barriers to Discharge: No Barriers Identified  Patient Goals and CMS Choice CMS Medicare.gov Compare Post Acute Care list provided to:: Patient Choice offered to / list presented to : Patient  Discharge Plan and Services Additional resources added to the After Visit Summary for      DME Arranged: Walker youth DME Agency: [employer] Representative spoke with at DME Agency: Prearranged in orthopedist's office  Social Determinants of Health (SDOH) Interventions SDOH Screenings   Food Insecurity: No Food Insecurity (08/14/2022)  Housing: Low Risk  (08/14/2022)  Transportation Needs: No Transportation Needs (08/14/2022)  Utilities: Not At Risk (08/14/2022)  Depression (PHQ2-9): Low Risk  (08/13/2018)  Tobacco Use: Low Risk  (08/14/2022)   Readmission Risk Interventions     No data to display

## 2022-08-15 NOTE — Progress Notes (Signed)
Physical Therapy Treatment Patient Details Name: Casey Newton MRN: [id number] DOB: 23-Feb-1956 Today's Date: 08/15/2022   History of Present Illness Pt s/p L THR    PT Comments    Pt motivated and progressing well with mobility.  Pt up to ambulate in hall, up to bathroom for toileting and hand hygiene at sink, reviewed written HEP with progression, and reviewed car transfers.  Pt eager for dc home this date.   Recommendations for follow up therapy are one component of a multi-disciplinary discharge planning process, led by the attending physician.  Recommendations may be updated based on patient status, additional functional criteria and insurance authorization.  Follow Up Recommendations       Assistance Recommended at Discharge PRN  Patient can return home with the following     Equipment Recommendations  Rolling walker (2 wheels)    Recommendations for Other Services       Precautions / Restrictions Precautions Precautions: Fall Restrictions Weight Bearing Restrictions: No     Mobility  Bed Mobility Overal bed mobility: Needs Assistance Bed Mobility: Supine to Sit     Supine to sit: Min assist     General bed mobility comments: Increased time with cues for sequence, posture and position from RW    Transfers Overall transfer level: Needs assistance Equipment used: Rolling walker (2 wheels) Transfers: Sit to/from Stand Sit to Stand: Min guard, Supervision           General transfer comment: cues for LE management and use of UEs to self assist    Ambulation/Gait Ambulation/Gait assistance: Min guard, Supervision Gait Distance (Feet): 140 Feet (and 20' into bathroom) Assistive device: Rolling walker (2 wheels) Gait Pattern/deviations: Step-to pattern, Decreased step length - right, Decreased step length - left, Shuffle, Trunk flexed Gait velocity: decr     General Gait Details: cues for sequence, posture and position from RW   Stairs Stairs:  Yes Stairs assistance: Min assist Stair Management: One rail Left, Step to pattern, Forwards, Sideways, With cane Number of Stairs: 7 General stair comments: 2 stairs sideways and 5 stairs with rail and cane; cues for sequence   Wheelchair Mobility    Modified Rankin (Stroke Patients Only)       Balance Overall balance assessment: Mild deficits observed, not formally tested                                          Cognition Arousal/Alertness: Awake/alert Behavior During Therapy: WFL for tasks assessed/performed Overall Cognitive Status: Within Functional Limits for tasks assessed                                          Exercises Total Joint Exercises Ankle Circles/Pumps: AROM, Both, 15 reps, Supine Quad Sets: AROM, Both, 10 reps, Supine Heel Slides: AAROM, Left, 20 reps, Supine Hip ABduction/ADduction: AAROM, Left, 15 reps, Supine Long Arc Quad: AAROM, Left, 10 reps, Seated    General Comments        Pertinent Vitals/Pain Pain Assessment Pain Assessment: 0-10 Pain Score: 4  Pain Location: L hip Pain Descriptors / Indicators: Aching, Sore Pain Intervention(s): Limited activity within patient's tolerance, Monitored during session, Premedicated before session, Ice applied    Home Living Family/patient expects to be discharged to:: Private residence Living Arrangements: Spouse/significant other Available  Help at Discharge: Family Type of Home: House Home Access: Stairs to enter Entrance Stairs-Rails: Left Entrance Stairs-Number of Steps: 3   Home Layout: One level Home Equipment: Cane - single point      Prior Function            PT Goals (current goals can now be found in the care plan section) Acute Rehab PT Goals Patient Stated Goal: regain IND PT Goal Formulation: With patient Time For Goal Achievement: 08/22/22 Potential to Achieve Goals: Good Progress towards PT goals: Progressing toward goals     Frequency    7X/week      PT Plan Current plan remains appropriate    Co-evaluation              AM-PAC PT "6 Clicks" Mobility   Outcome Measure  Help needed turning from your back to your side while in a flat bed without using bedrails?: A Little Help needed moving from lying on your back to sitting on the side of a flat bed without using bedrails?: A Little Help needed moving to and from a bed to a chair (including a wheelchair)?: A Little Help needed standing up from a chair using your arms (e.g., wheelchair or bedside chair)?: A Little Help needed to walk in hospital room?: A Little Help needed climbing 3-5 steps with a railing? : A Little 6 Click Score: 18    End of Session Equipment Utilized During Treatment: Gait belt Activity Tolerance: Patient tolerated treatment well Patient left: in chair;with call bell/phone within reach;with chair alarm set;with nursing/sitter in room Nurse Communication: Mobility status PT Visit Diagnosis: Difficulty in walking, not elsewhere classified (R26.2)     Time: 6045-4098 PT Time Calculation (min) (ACUTE ONLY): 42 min  Charges:  $Gait Training: 8-22 mins $Therapeutic Exercise: 8-22 mins $Therapeutic Activity: 8-22 mins                     Mauro Kaufmann PT Acute Rehabilitation Services Pager [phone] Office [phone]    Ramie Palladino 08/15/2022, 12:22 PM

## 2022-08-15 NOTE — Plan of Care (Signed)
  Problem: Clinical Measurements: Goal: Postoperative complications will be avoided or minimized Outcome: Progressing   Problem: Pain Management: Goal: Pain level will decrease with appropriate interventions Outcome: Progressing   

## 2022-08-15 NOTE — Evaluation (Signed)
Physical Therapy Evaluation Patient Details Name: Casey Newton MRN: [id number] DOB: 04/17/1955 Today's Date: 08/15/2022  History of Present Illness  Pt s/p L THR  Clinical Impression  Pt s/p L THR and presents with decreased L LE strength/ROM and post op pain limiting functional mobility.  Pt shoulf progress well to dc home with family assist.     Recommendations for follow up therapy are one component of a multi-disciplinary discharge planning process, led by the attending physician.  Recommendations may be updated based on patient status, additional functional criteria and insurance authorization.  Follow Up Recommendations       Assistance Recommended at Discharge PRN  Patient can return home with the following       Equipment Recommendations Rolling walker (2 wheels) (youth)  Recommendations for Other Services       Functional Status Assessment Patient has had a recent decline in their functional status and demonstrates the ability to make significant improvements in function in a reasonable and predictable amount of time.     Precautions / Restrictions Precautions Precautions: Fall Restrictions Weight Bearing Restrictions: No      Mobility  Bed Mobility Overal bed mobility: Needs Assistance Bed Mobility: Supine to Sit     Supine to sit: Min assist     General bed mobility comments: Increased time with cues for sequence, posture and position from RW    Transfers Overall transfer level: Needs assistance Equipment used: Rolling walker (2 wheels) Transfers: Sit to/from Stand Sit to Stand: Min guard           General transfer comment: cues for LE management and use of UEs to self assist    Ambulation/Gait Ambulation/Gait assistance: Min assist, Min guard Gait Distance (Feet): 20 Feet (20' twice3 to/from bathroom) Assistive device: Rolling walker (2 wheels) Gait Pattern/deviations: Step-to pattern, Decreased step length - right, Decreased step  length - left, Shuffle, Trunk flexed Gait velocity: decr     General Gait Details: cues for sequence, posture and position from [employer]            Wheelchair Mobility    Modified Rankin (Stroke Patients Only)       Balance Overall balance assessment: Mild deficits observed, not formally tested                                           Pertinent Vitals/Pain Pain Assessment Pain Assessment: 0-10 Pain Score: 4  Pain Location: L hip Pain Descriptors / Indicators: Aching, Sore Pain Intervention(s): Monitored during session, Limited activity within patient's tolerance, Premedicated before session, Ice applied    Home Living Family/patient expects to be discharged to:: Private residence Living Arrangements: Spouse/significant other Available Help at Discharge: Family Type of Home: House Home Access: Stairs to enter Entrance Stairs-Rails: Left Entrance Stairs-Number of Steps: 3   Home Layout: One level Home Equipment: Cane - single point      Prior Function Prior Level of Function : Independent/Modified Independent                     Hand Dominance        Extremity/Trunk Assessment   Upper Extremity Assessment Upper Extremity Assessment: Overall WFL for tasks assessed    Lower Extremity Assessment Lower Extremity Assessment: LLE deficits/detail LLE Deficits / Details: 2+/5 strength at hip with AAROM at hip to 75 flex and 15  abd    Cervical / Trunk Assessment Cervical / Trunk Assessment: Normal  Communication   Communication: No difficulties  Cognition Arousal/Alertness: Awake/alert Behavior During Therapy: WFL for tasks assessed/performed Overall Cognitive Status: Within Functional Limits for tasks assessed                                          General Comments      Exercises Total Joint Exercises Ankle Circles/Pumps: AROM, Both, 15 reps, Supine Quad Sets: AROM, Both, 10 reps, Supine Heel  Slides: AAROM, Left, 20 reps, Supine Hip ABduction/ADduction: AAROM, Left, 15 reps, Supine Long Arc Quad: AAROM, Left, 10 reps, Seated   Assessment/Plan    PT Assessment Patient needs continued PT services  PT Problem List Decreased strength;Decreased range of motion;Decreased activity tolerance;Decreased balance;Decreased mobility;Decreased knowledge of use of DME;Pain       PT Treatment Interventions DME instruction;Gait training;Stair training;Functional mobility training;Therapeutic activities;Therapeutic exercise;Patient/family education    PT Goals (Current goals can be found in the Care Plan section)  Acute Rehab PT Goals Patient Stated Goal: regain IND PT Goal Formulation: With patient Time For Goal Achievement: 08/22/22 Potential to Achieve Goals: Good    Frequency 7X/week     Co-evaluation               AM-PAC PT "6 Clicks" Mobility  Outcome Measure Help needed turning from your back to your side while in a flat bed without using bedrails?: A Little Help needed moving from lying on your back to sitting on the side of a flat bed without using bedrails?: A Little Help needed moving to and from a bed to a chair (including a wheelchair)?: A Little Help needed standing up from a chair using your arms (e.g., wheelchair or bedside chair)?: A Little Help needed to walk in hospital room?: A Little Help needed climbing 3-5 steps with a railing? : A Lot 6 Click Score: 17    End of Session Equipment Utilized During Treatment: Gait belt Activity Tolerance: Patient tolerated treatment well Patient left: in chair;with call bell/phone within reach;with chair alarm set;with nursing/sitter in room Nurse Communication: Mobility status PT Visit Diagnosis: Difficulty in walking, not elsewhere classified (R26.2)    Time: 1610-9604 PT Time Calculation (min) (ACUTE ONLY): 26 min   Charges:   PT Evaluation $PT Eval Low Complexity: 1 Low PT Treatments $Therapeutic Exercise:  8-22 mins        Mauro Kaufmann PT Acute Rehabilitation Services Pager [phone] Office [phone]   Jacobey Gura 08/15/2022, 12:16 PM

## 2022-08-15 NOTE — Progress Notes (Addendum)
    Subjective:  Patient reports pain as mild.  Denies N/V/CP/SOB/Abd pain. She reports mild soreness in thigh. Denies any tingling or numbness in LE bilaterally.   Objective:   VITALS:   Vitals:   08/14/22 2135 08/14/22 2228 08/15/22 0159 08/15/22 0547  BP: (!) 142/82  129/74 135/70  Pulse: (!) 59 64 (!) 54 (!) 51  Resp: 18 18 17 17   Temp: 98.1 F (36.7 C)  98.1 F (36.7 C) 97.8 F (36.6 C)  TempSrc:      SpO2: 96% 98% 97% 98%  Weight:      Height:        Patient sitting up in bed. NAD.  Neurologically intact ABD soft Neurovascular intact Sensation intact distally Intact pulses distally Dorsiflexion/Plantar flexion intact Incision: dressing C/D/I No cellulitis present Compartment soft   Lab Results  Component Value Date   WBC 5.3 08/04/2022   HGB 12.5 08/04/2022   HCT 39.1 08/04/2022   MCV 89.9 08/04/2022   PLT 259 08/04/2022   BMET    Component Value Date/Time   NA 138 08/04/2022 1455   K 3.7 08/04/2022 1455   CL 102 08/04/2022 1455   CO2 27 08/04/2022 1455   GLUCOSE 95 08/04/2022 1455   BUN 12 08/04/2022 1455   CREATININE 0.84 08/04/2022 1455   CALCIUM 9.3 08/04/2022 1455   GFRNONAA >60 08/04/2022 1455     Assessment/Plan: 1 Day Post-Op   Principal Problem:   Osteoarthritis of left hip  ABLA. Hemoglobin 9.3. Asymptomatic. Continue to monitor.   WBAT with walker DVT ppx: Aspirin, SCDs, TEDS PO pain control PT/OT: PT has not seen yet. PT to come by today.  Dispo: D/c home with HEP once cleared with PT and has voided.     Clois Dupes, PA-C 08/15/2022, 7:32 AM   [facility]  [place] [address], [address], [place], [place] [address] Phone: [phone] [email or web address] Facebook  [employer]

## 2022-10-01 ENCOUNTER — Other Ambulatory Visit: Payer: Self-pay | Admitting: Geriatric Medicine

## 2022-10-01 DIAGNOSIS — Z1231 Encounter for screening mammogram for malignant neoplasm of breast: Secondary | ICD-10-CM

## 2022-10-09 ENCOUNTER — Inpatient Hospital Stay: Admission: RE | Admit: 2022-10-09 | Payer: Medicare HMO | Source: Ambulatory Visit

## 2022-10-10 ENCOUNTER — Encounter: Payer: Self-pay | Admitting: Podiatry

## 2022-10-10 ENCOUNTER — Ambulatory Visit: Payer: Medicare HMO | Admitting: Podiatry

## 2022-10-10 DIAGNOSIS — B351 Tinea unguium: Secondary | ICD-10-CM | POA: Diagnosis not present

## 2022-10-10 DIAGNOSIS — M79674 Pain in right toe(s): Secondary | ICD-10-CM | POA: Diagnosis not present

## 2022-10-10 DIAGNOSIS — M79675 Pain in left toe(s): Secondary | ICD-10-CM

## 2022-10-10 NOTE — Progress Notes (Signed)

## 2022-10-17 ENCOUNTER — Ambulatory Visit: Admission: RE | Admit: 2022-10-17 | Payer: Medicare HMO | Source: Ambulatory Visit

## 2022-10-17 DIAGNOSIS — Z1231 Encounter for screening mammogram for malignant neoplasm of breast: Secondary | ICD-10-CM

## 2022-11-13 IMAGING — MG MM DIGITAL SCREENING BILAT W/ TOMO AND CAD
8 of 14 series · 8 of 40 positions shown · non-contrast
Comparison: Previous exam(s).

CLINICAL DATA: Screening.

EXAM:
DIGITAL SCREENING BILATERAL MAMMOGRAM WITH TOMOSYNTHESIS AND CAD
TECHNIQUE: Bilateral screening digital craniocaudal and mediolateral oblique
mammograms were obtained. Bilateral screening digital breast
tomosynthesis was performed. The images were evaluated with
computer-aided detection.

[L MLO synth-2D (1 of 2)]
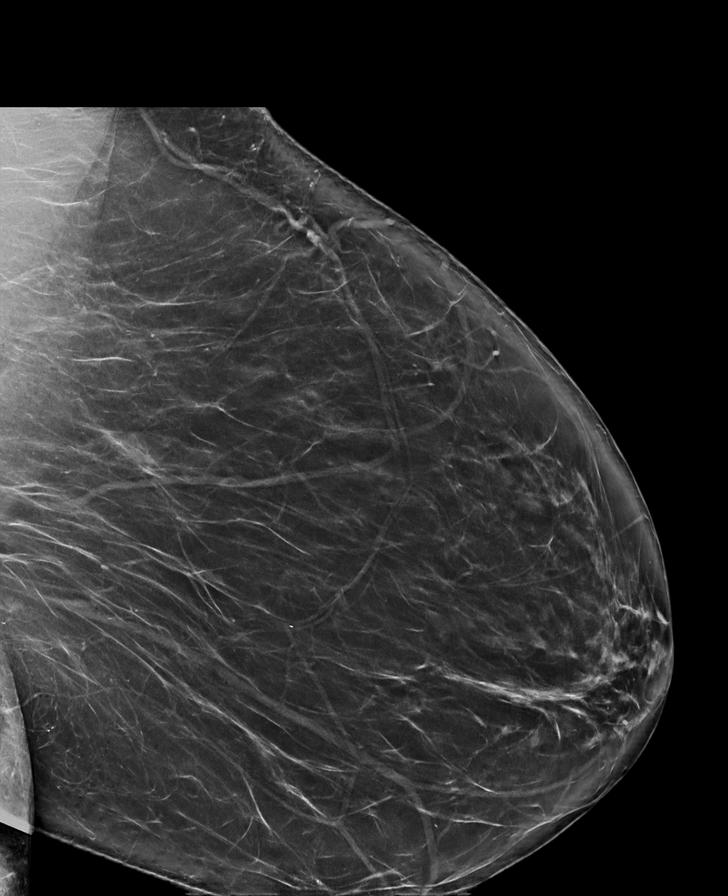

[L CC synth-2D]
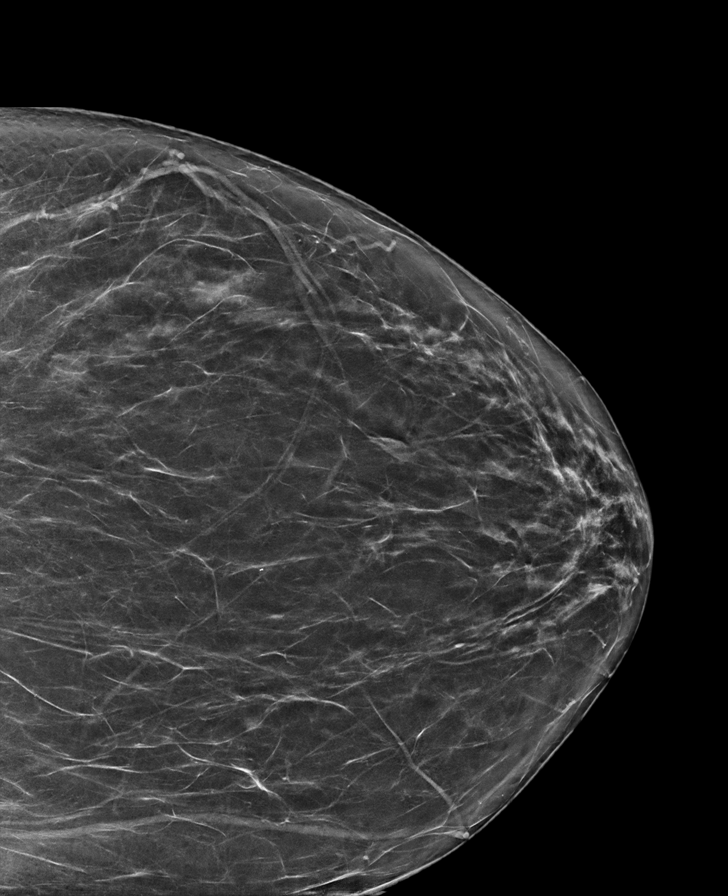

[R MLO synth-2D (1 of 2)]
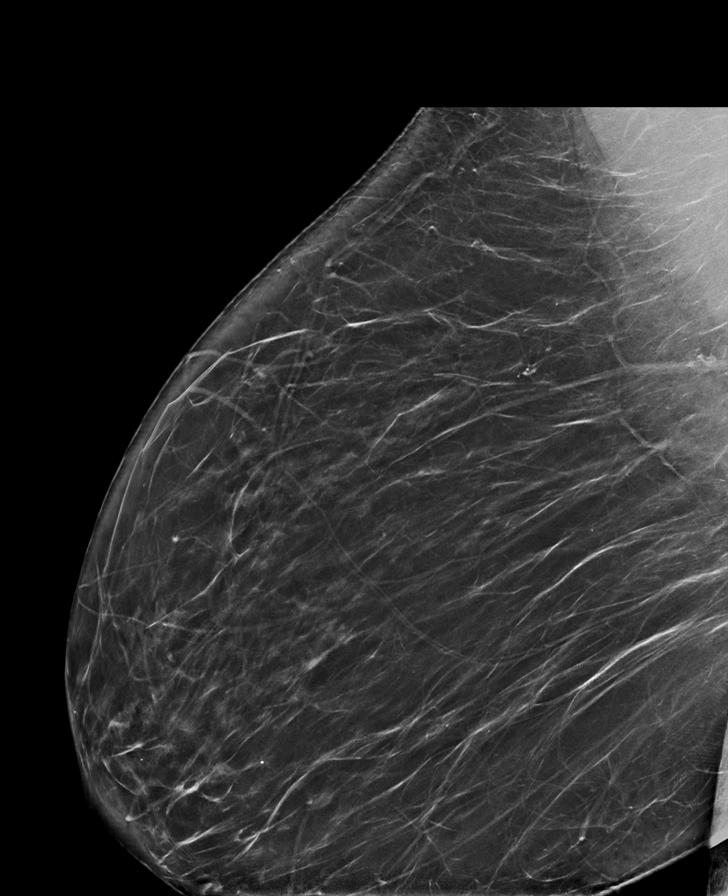

[R CV synth-2D]
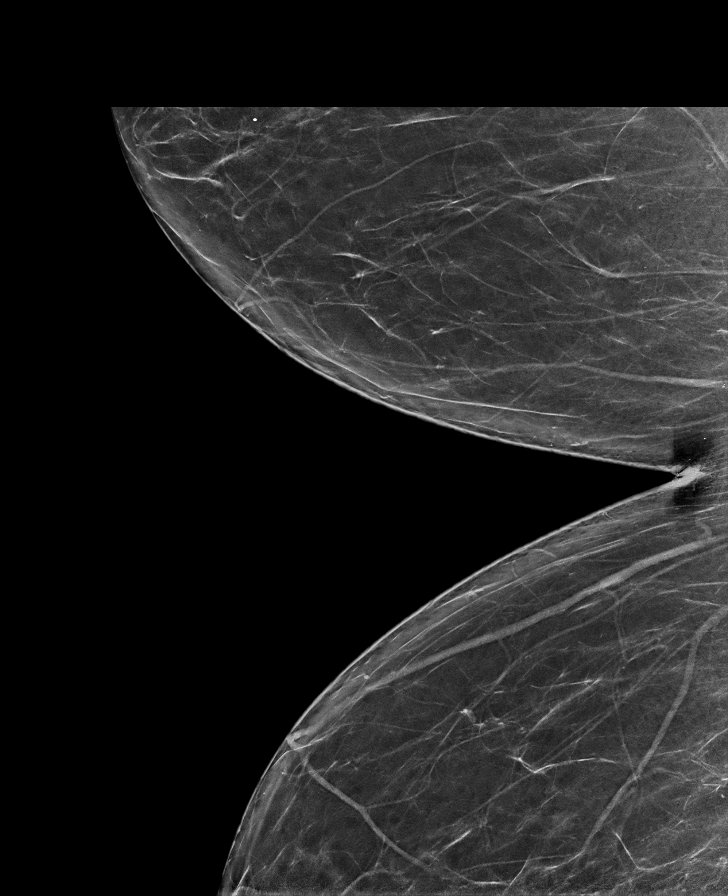

[L MLO synth-2D (2 of 2)]
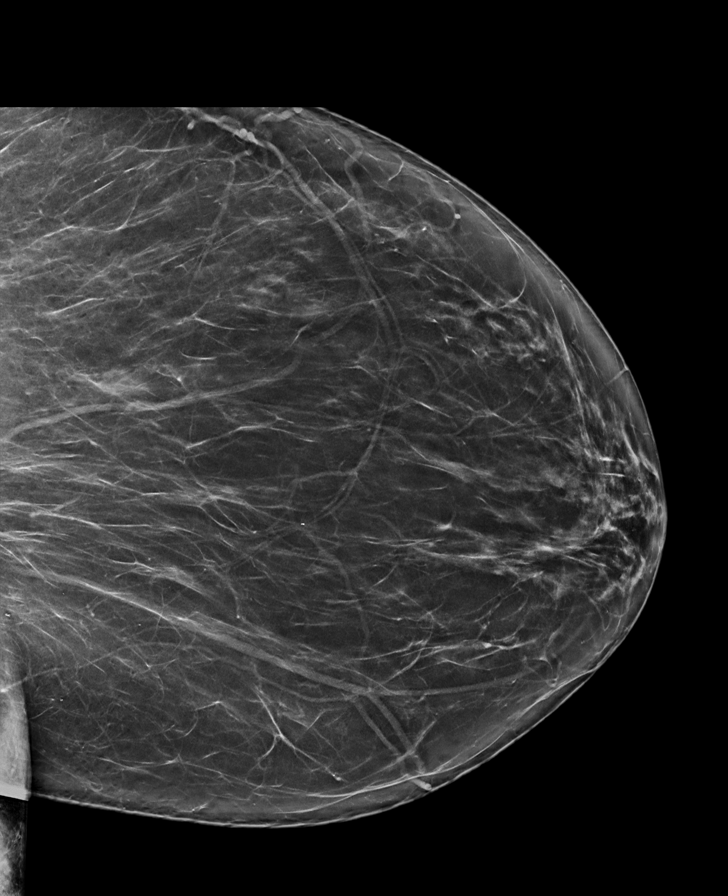

[R CC synth-2D]
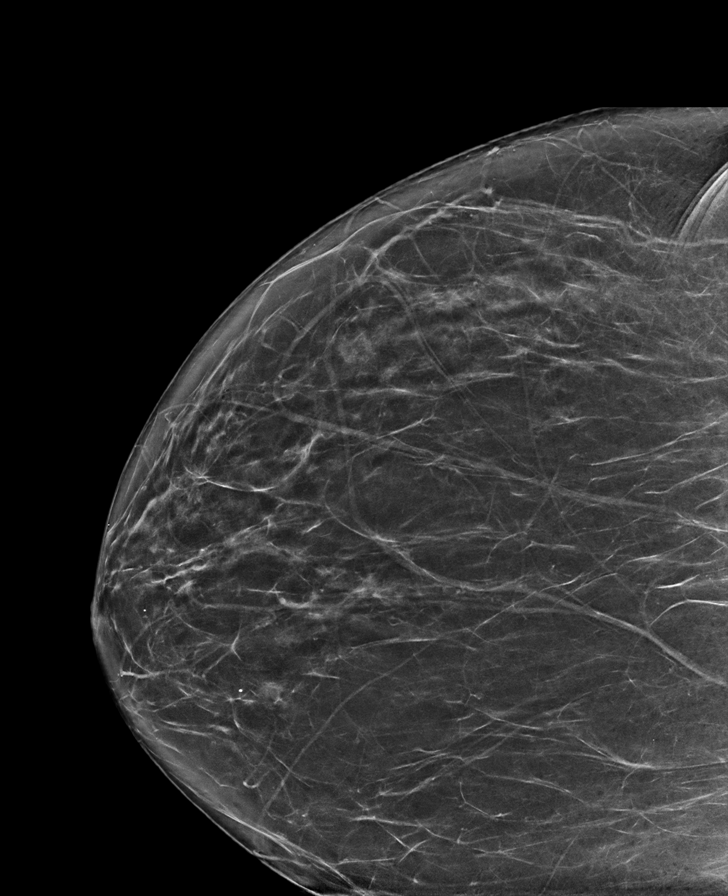

[R MLO synth-2D (2 of 2)]
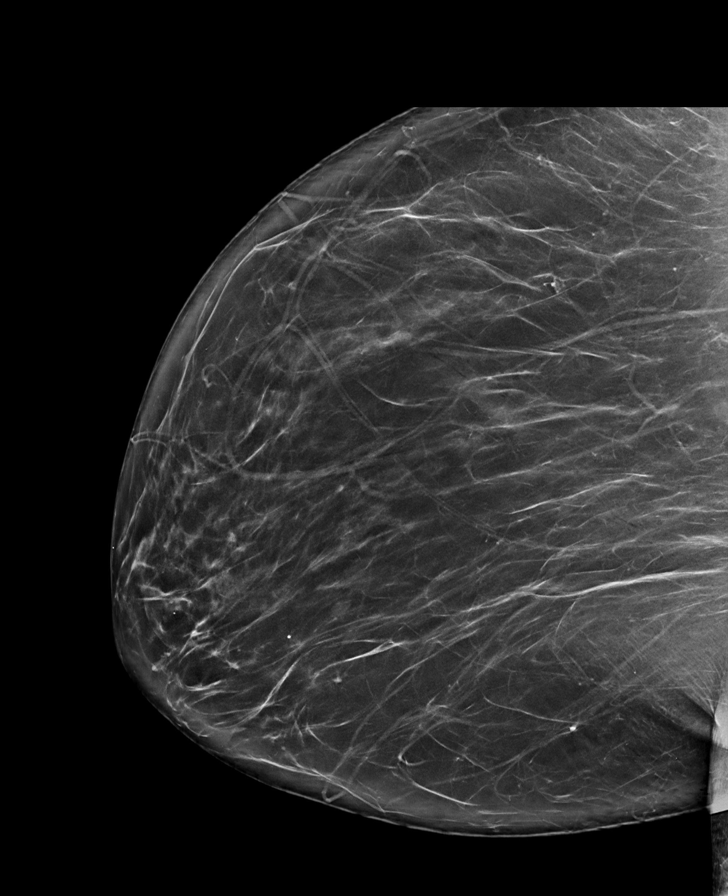

[R MLO tomo · tomo slice 46/91.0]
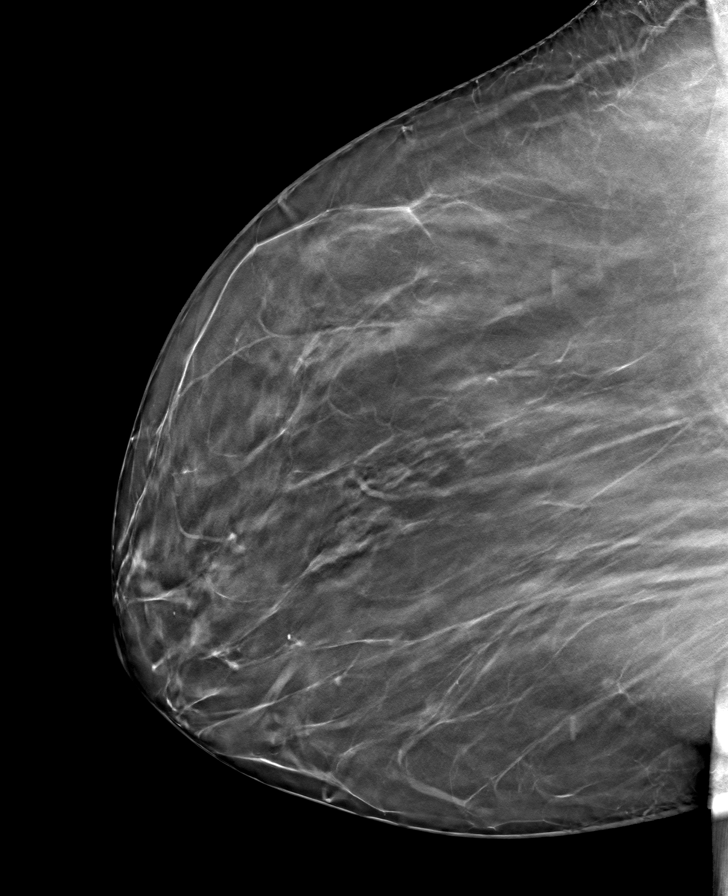

[8 of 40 positions shown; findings below may reference images not displayed]

ACR Breast Density Category b: There are scattered areas of
fibroglandular density.
FINDINGS: There are no findings suspicious for malignancy.
IMPRESSION: No mammographic evidence of malignancy. A result letter of this
screening mammogram will be mailed directly to the patient.

RECOMMENDATION:
Screening mammogram in one year. (Code:51-O-LD2)

BI-RADS CATEGORY  1: Negative.

## 2023-07-24 ENCOUNTER — Ambulatory Visit: Admitting: Podiatry

## 2023-07-24 DIAGNOSIS — M79675 Pain in left toe(s): Secondary | ICD-10-CM

## 2023-07-24 DIAGNOSIS — B351 Tinea unguium: Secondary | ICD-10-CM

## 2023-07-24 DIAGNOSIS — M79674 Pain in right toe(s): Secondary | ICD-10-CM

## 2023-07-24 NOTE — Progress Notes (Signed)

## 2023-11-04 ENCOUNTER — Other Ambulatory Visit: Payer: Self-pay | Admitting: Family

## 2023-11-04 DIAGNOSIS — Z1231 Encounter for screening mammogram for malignant neoplasm of breast: Secondary | ICD-10-CM

## 2023-11-30 ENCOUNTER — Ambulatory Visit
Admission: RE | Admit: 2023-11-30 | Discharge: 2023-11-30 | Disposition: A | Discharge status: Home / Self Care | Source: Ambulatory Visit | Attending: Family | Admitting: Family

## 2023-11-30 DIAGNOSIS — Z1231 Encounter for screening mammogram for malignant neoplasm of breast: Secondary | ICD-10-CM

## 2024-01-22 ENCOUNTER — Ambulatory Visit: Admitting: Podiatry

## 2024-01-22 ENCOUNTER — Encounter: Payer: Self-pay | Admitting: Podiatry

## 2024-01-22 DIAGNOSIS — B351 Tinea unguium: Secondary | ICD-10-CM

## 2024-01-22 DIAGNOSIS — M79675 Pain in left toe(s): Secondary | ICD-10-CM

## 2024-01-22 DIAGNOSIS — M79674 Pain in right toe(s): Secondary | ICD-10-CM | POA: Diagnosis not present

## 2024-01-22 NOTE — Progress Notes (Signed)

## 2024-07-22 ENCOUNTER — Ambulatory Visit: Admitting: Podiatry
